# Patient Record
Sex: Female | Born: 1965 | Race: White | Hispanic: No | Marital: Married | Smoking: Never smoker
Health system: Southern US, Community
[De-identification: ages and names within clinical notes are randomized; demographics above are authoritative.]

## PROBLEM LIST (undated history)

## (undated) ENCOUNTER — Ambulatory Visit: Admission: EM | Payer: BC Managed Care – PPO

## (undated) DIAGNOSIS — D487 Neoplasm of uncertain behavior of other specified sites: Secondary | ICD-10-CM

## (undated) DIAGNOSIS — N3090 Cystitis, unspecified without hematuria: Secondary | ICD-10-CM

## (undated) DIAGNOSIS — Z1239 Encounter for other screening for malignant neoplasm of breast: Secondary | ICD-10-CM

## (undated) DIAGNOSIS — D649 Anemia, unspecified: Secondary | ICD-10-CM

## (undated) DIAGNOSIS — R21 Rash and other nonspecific skin eruption: Secondary | ICD-10-CM

## (undated) DIAGNOSIS — N6459 Other signs and symptoms in breast: Secondary | ICD-10-CM

## (undated) DIAGNOSIS — R5383 Other fatigue: Secondary | ICD-10-CM

## (undated) DIAGNOSIS — Z9289 Personal history of other medical treatment: Secondary | ICD-10-CM

## (undated) DIAGNOSIS — R5381 Other malaise: Secondary | ICD-10-CM

## (undated) HISTORY — DX: Anemia, unspecified: D64.9

## (undated) HISTORY — DX: Rash and other nonspecific skin eruption: R21

## (undated) HISTORY — DX: Other malaise: R53.81

## (undated) HISTORY — DX: Other fatigue: R53.83

## (undated) HISTORY — DX: Cystitis, unspecified without hematuria: N30.90

## (undated) HISTORY — DX: Other signs and symptoms in breast: N64.59

## (undated) HISTORY — DX: Encounter for other screening for malignant neoplasm of breast: Z12.39

## (undated) HISTORY — DX: Personal history of other medical treatment: Z92.89

## (undated) HISTORY — DX: Neoplasm of uncertain behavior of other specified sites: D48.7

---

## 2010-09-11 DIAGNOSIS — Z9289 Personal history of other medical treatment: Secondary | ICD-10-CM

## 2010-09-11 HISTORY — DX: Personal history of other medical treatment: Z92.89

## 2010-12-06 ENCOUNTER — Ambulatory Visit: Payer: Self-pay

## 2010-12-06 ENCOUNTER — Emergency Department: Payer: Self-pay | Admitting: Emergency Medicine

## 2011-09-12 DIAGNOSIS — N6459 Other signs and symptoms in breast: Secondary | ICD-10-CM

## 2011-09-12 DIAGNOSIS — D487 Neoplasm of uncertain behavior of other specified sites: Secondary | ICD-10-CM

## 2011-09-12 DIAGNOSIS — R5381 Other malaise: Secondary | ICD-10-CM

## 2011-09-12 DIAGNOSIS — N3090 Cystitis, unspecified without hematuria: Secondary | ICD-10-CM

## 2011-09-12 DIAGNOSIS — Z1239 Encounter for other screening for malignant neoplasm of breast: Secondary | ICD-10-CM

## 2011-09-12 DIAGNOSIS — R5383 Other fatigue: Secondary | ICD-10-CM

## 2011-09-12 DIAGNOSIS — R21 Rash and other nonspecific skin eruption: Secondary | ICD-10-CM

## 2011-09-12 HISTORY — DX: Cystitis, unspecified without hematuria: N30.90

## 2011-09-12 HISTORY — DX: Other signs and symptoms in breast: N64.59

## 2011-09-12 HISTORY — DX: Other malaise: R53.83

## 2011-09-12 HISTORY — PX: TUBAL LIGATION: SHX77

## 2011-09-12 HISTORY — DX: Neoplasm of uncertain behavior of other specified sites: D48.7

## 2011-09-12 HISTORY — DX: Rash and other nonspecific skin eruption: R21

## 2011-09-12 HISTORY — DX: Other malaise: R53.81

## 2011-09-12 HISTORY — DX: Encounter for other screening for malignant neoplasm of breast: Z12.39

## 2012-03-20 ENCOUNTER — Ambulatory Visit: Payer: Self-pay | Admitting: Obstetrics and Gynecology

## 2012-04-25 ENCOUNTER — Ambulatory Visit: Payer: Self-pay | Admitting: General Surgery

## 2012-07-30 ENCOUNTER — Ambulatory Visit: Payer: Self-pay | Admitting: General Surgery

## 2012-09-11 HISTORY — PX: BREAST CYST ASPIRATION: SHX578

## 2012-11-26 ENCOUNTER — Ambulatory Visit: Payer: Self-pay | Admitting: General Surgery

## 2012-12-09 ENCOUNTER — Encounter: Payer: Self-pay | Admitting: *Deleted

## 2012-12-09 DIAGNOSIS — D487 Neoplasm of uncertain behavior of other specified sites: Secondary | ICD-10-CM | POA: Insufficient documentation

## 2012-12-09 DIAGNOSIS — Z9289 Personal history of other medical treatment: Secondary | ICD-10-CM | POA: Insufficient documentation

## 2012-12-16 ENCOUNTER — Encounter: Payer: Self-pay | Admitting: General Surgery

## 2012-12-16 ENCOUNTER — Other Ambulatory Visit: Payer: Self-pay

## 2012-12-16 ENCOUNTER — Ambulatory Visit (INDEPENDENT_AMBULATORY_CARE_PROVIDER_SITE_OTHER): Payer: BC Managed Care – PPO | Admitting: General Surgery

## 2012-12-16 VITALS — BP 130/74 | HR 74 | Resp 14 | Ht 64.0 in | Wt 163.0 lb

## 2012-12-16 DIAGNOSIS — N6459 Other signs and symptoms in breast: Secondary | ICD-10-CM

## 2012-12-16 NOTE — Patient Instructions (Addendum)
Patient to return in sept.2014

## 2012-12-16 NOTE — Progress Notes (Signed)
Patient ID: Yesenia Flores, female   DOB: 19-May-1966, 47 y.o.   MRN: [id number]  Chief Complaint  Patient presents with  . Follow-up    breast u/s    HPI Yesenia Flores is a 47 y.o. female here today for her follow up right ultrasound.  The patient has followed up with [name], M.D. From the ENT service in regards to her sense of fullness in the lower neck. The previously reported flushing has resolved. The patient reports she is more symptomatic towards the end of the day. She has previously been evaluated with CT without discernible abnormality. HPI  Past Medical History  Diagnosis Date  . Other malaise and fatigue 2013  . Breast screening, unspecified 2013  . Other sign and symptom in breast 2013  . 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 Rash and other nonspecific skin eruption 2013  . Bladder infection 2013  . History of tubal ligation 2013  . H/O CT scan of chest 2012    Past Surgical History  Procedure Laterality Date  . Cesarean section  4098,1191    No family history on file.  Social History History  Substance Use Topics  . Smoking status: Never Smoker   . Smokeless tobacco: Never Used  . Alcohol Use: No    No Known Allergies  No current outpatient prescriptions on file.   No current facility-administered medications for this visit.    Review of Systems Review of Systems  Constitutional: Negative.   Respiratory: Negative.   Cardiovascular: Negative.     Blood pressure 130/74, pulse 74, resp. rate 14, height 5\' 4"  (1.626 m), weight 163 lb (73.936 kg), last menstrual period 11/19/2012.  Physical Exam Physical Exam  Constitutional: She appears well-developed and well-nourished.  Eyes: Conjunctivae are normal. Pupils are equal, round, and reactive to light.  Neck: Normal range of motion. Neck supple.  Cardiovascular: Normal rate, regular rhythm and normal heart sounds.   Pulmonary/Chest: Effort normal and breath sounds  normal. Right breast exhibits no inverted nipple, no mass, no nipple discharge, no skin change and no tenderness. Left breast exhibits no inverted nipple, no mass, no nipple discharge, no skin change and no tenderness.  Lymphadenopathy:       Right axillary: No pectoral and no lateral adenopathy present.       Left axillary: No pectoral and no lateral adenopathy present.   Data Reviewed  Right breast mammogram dated November 27, 2012 showed nodularity which had not dramatically changed from prior exams. BI-RAD-3.  Ultrasound examination of the right breast was completed.  At the 7:00 position 4 cm from the nipple a 0.47 x 0.54 x 0.8 cm simple cyst is identified. At the 12:00 position 1 cm from the nipple a heterogeneous hypoechoic area with acoustic enhancement measuring 0.64 x 0.99 x 1.22 cm is noted. Adjacent to this is a 0.73 x 0.74 0.78 simple cyst. At the 10:00 position 2 cm for the nipple a taller than wide anechoic structure measuring 0.62 x 0.69 x 0.78 cm was identified. Aspiration was recommended as the orientation was significantly different from all the lesions in her breast. She was amenable to proceed.  1 cc of 1% plain Xylocaine was utilized and the area was aspirated with complete resolution. She tolerated the procedure well.  Assessment    Multiple breast cysts, questionable solid lesion at the 12:00 position.  No evidence of cervical or supraclavicular adenopathy.    Plan    The patient will be  contact with the cytology results are available.        Yesenia Flores 12/17/2012, 9:33 PM

## 2012-12-17 ENCOUNTER — Encounter: Payer: Self-pay | Admitting: General Surgery

## 2012-12-19 ENCOUNTER — Telehealth: Payer: Self-pay | Admitting: *Deleted

## 2012-12-19 LAB — FINE-NEEDLE ASPIRATION

## 2012-12-19 NOTE — Telephone Encounter (Signed)
Patient called in today wanting results from 12-16-12. I did not see anything in the system. Have you received a verbal. Please call patient once we get results. Thanks.

## 2012-12-20 NOTE — Progress Notes (Signed)
Quick Note:  The cytology obtained on the cystic lesion that was taller than it was wide was benign.  The patient was notified of these results today.  Will plan for a followup examination with bilateral mammograms in 6 months. ______

## 2012-12-20 NOTE — Progress Notes (Signed)
Patient is in the recalls for bilateral diagnostic mammogram at [facility] for September 2014. Thanks.

## 2013-01-06 ENCOUNTER — Emergency Department: Payer: Self-pay | Admitting: Emergency Medicine

## 2013-01-06 LAB — CBC WITH DIFFERENTIAL/PLATELET
Basophil #: 0.1 10*3/uL (ref 0.0–0.1)
Basophil %: 1.7 %
HCT: 32.5 % — ABNORMAL LOW (ref 35.0–47.0)
Lymphocyte #: 1.3 10*3/uL (ref 1.0–3.6)
Lymphocyte %: 19.6 %
MCH: 25.9 pg — ABNORMAL LOW (ref 26.0–34.0)
Monocyte #: 0.6 x10 3/mm (ref 0.2–0.9)
Neutrophil #: 4.7 10*3/uL (ref 1.4–6.5)
Platelet: 277 10*3/uL (ref 150–440)
RDW: 14.9 % — ABNORMAL HIGH (ref 11.5–14.5)
WBC: 6.9 10*3/uL (ref 3.6–11.0)

## 2013-01-06 LAB — BASIC METABOLIC PANEL
Anion Gap: 7 (ref 7–16)
Calcium, Total: 9.1 mg/dL (ref 8.5–10.1)
Creatinine: 0.77 mg/dL (ref 0.60–1.30)
Glucose: 100 mg/dL — ABNORMAL HIGH (ref 65–99)

## 2013-04-28 ENCOUNTER — Emergency Department: Payer: Self-pay | Admitting: Emergency Medicine

## 2013-04-28 LAB — COMPREHENSIVE METABOLIC PANEL
Albumin: 3.7 g/dL (ref 3.4–5.0)
Alkaline Phosphatase: 65 U/L (ref 50–136)
Anion Gap: 4 — ABNORMAL LOW (ref 7–16)
BUN: 10 mg/dL (ref 7–18)
Calcium, Total: 9.1 mg/dL (ref 8.5–10.1)
Chloride: 108 mmol/L — ABNORMAL HIGH (ref 98–107)
Co2: 25 mmol/L (ref 21–32)
EGFR (African American): >60
EGFR (Non-African Amer.): >60
Glucose: 94 mg/dL (ref 65–99)
Osmolality: 273 (ref 275–301)
Potassium: 4 mmol/L (ref 3.5–5.1)
SGOT(AST): 20 U/L (ref 15–37)
SGPT (ALT): 15 U/L (ref 12–78)
Sodium: 137 mmol/L (ref 136–145)
Total Protein: 7.4 g/dL (ref 6.4–8.2)

## 2013-04-28 LAB — CBC
HCT: 32 % — ABNORMAL LOW (ref 35.0–47.0)
MCV: 77 fL — ABNORMAL LOW (ref 80–100)
Platelet: 230 10*3/uL (ref 150–440)
RDW: 15 % — ABNORMAL HIGH (ref 11.5–14.5)
WBC: 6.1 10*3/uL (ref 3.6–11.0)

## 2013-04-28 LAB — TROPONIN I: Troponin-I: <0.02 ng/mL

## 2013-05-06 ENCOUNTER — Ambulatory Visit: Payer: Self-pay | Admitting: Obstetrics and Gynecology

## 2013-06-10 ENCOUNTER — Ambulatory Visit: Payer: BC Managed Care – PPO | Admitting: General Surgery

## 2013-07-09 ENCOUNTER — Encounter: Payer: Self-pay | Admitting: *Deleted

## 2013-07-17 ENCOUNTER — Other Ambulatory Visit: Payer: Self-pay

## 2014-05-29 ENCOUNTER — Emergency Department: Payer: Self-pay | Admitting: Emergency Medicine

## 2014-07-08 ENCOUNTER — Encounter: Payer: Self-pay | Admitting: General Surgery

## 2014-07-08 ENCOUNTER — Ambulatory Visit: Payer: Self-pay | Admitting: General Surgery

## 2014-07-13 ENCOUNTER — Encounter: Payer: Self-pay | Admitting: General Surgery

## 2014-07-14 ENCOUNTER — Telehealth: Payer: Self-pay

## 2014-07-14 NOTE — Telephone Encounter (Signed)
Message left for patient to call back to reschedule her follow up appointment with Dr Bary Castilla after she has additional mammogram views done as recommended.

## 2014-07-14 NOTE — Telephone Encounter (Signed)
-----   Message from Robert Bellow, MD sent at 07/09/2014  4:28 PM EDT ----- The patient needs an appointment after additional views of the left breast. Thanks/ ----- Message -----    From: Darrin Nipper, CMA    Sent: 07/08/2014   4:44 PM      To: Robert Bellow, MD

## 2014-07-17 ENCOUNTER — Ambulatory Visit: Payer: Self-pay | Admitting: General Surgery

## 2014-07-20 ENCOUNTER — Encounter: Payer: Self-pay | Admitting: General Surgery

## 2015-01-07 ENCOUNTER — Telehealth: Payer: Self-pay | Admitting: General Surgery

## 2015-01-07 NOTE — Telephone Encounter (Signed)
ERROR

## 2015-01-07 NOTE — Telephone Encounter (Signed)
L/M ON H# TO CALL ME BACK TO SET UP AN APPT WITH DR BYRNETT.SEE TELEPHONE NOTE ENTERED BY CARYL-LYN. SPOKE [facility] COME IN & SEE DR BYRNETT & IF HE WANTS A MAMMO IT WILL BE SCH'D @ THAT TIME. IF SHE CHOOSE TO SEE DR Adelfa Koh MUST FIRST ASK HIM. PLEASE MAKE PT AWARE OF BAD DEBT AMT & WILL NEED TO PAY SOMETHING AT VISIT.WILL NEEDED HER INS, LICENSE, CO-PAY & MEDS.HER LAST NAME HAS ONLY BEEN CHANGED IN THE REGISTRATION FIELD(ALREADY CHANGED BEFORE I BEGAN WORKING WITH PT).WE NEED PROPER ID TO TRULY CHANGE NAME & THEN CAN CHANGE GUARANTOR(THAT WASN'T CHANGED)  BEFORE CHANGING ANY NAME CK WITH MAUREEN BYRNTT/MTH

## 2015-01-20 ENCOUNTER — Ambulatory Visit: Payer: Self-pay | Admitting: General Surgery

## 2015-02-10 ENCOUNTER — Encounter: Payer: Self-pay | Admitting: *Deleted

## 2015-05-27 ENCOUNTER — Encounter: Payer: Self-pay | Admitting: Emergency Medicine

## 2015-05-27 ENCOUNTER — Telehealth: Payer: Self-pay | Admitting: Emergency Medicine

## 2015-05-27 ENCOUNTER — Emergency Department
Admission: EM | Admit: 2015-05-27 | Discharge: 2015-05-27 | Disposition: A | Discharge status: Home / Self Care | Payer: 59 | Attending: Emergency Medicine | Admitting: Emergency Medicine

## 2015-05-27 ENCOUNTER — Other Ambulatory Visit: Payer: Self-pay

## 2015-05-27 ENCOUNTER — Emergency Department: Payer: 59

## 2015-05-27 DIAGNOSIS — R079 Chest pain, unspecified: Secondary | ICD-10-CM | POA: Diagnosis not present

## 2015-05-27 DIAGNOSIS — M7918 Myalgia, other site: Secondary | ICD-10-CM

## 2015-05-27 LAB — BASIC METABOLIC PANEL
Anion gap: 6 (ref 5–15)
BUN: 10 mg/dL (ref 6–20)
CHLORIDE: 107 mmol/L (ref 101–111)
CO2: 27 mmol/L (ref 22–32)
Calcium: 9.5 mg/dL (ref 8.9–10.3)
Creatinine, Ser: 0.91 mg/dL (ref 0.44–1.00)
GFR calc non Af Amer: >60 mL/min (ref >60)
Glucose, Bld: 99 mg/dL (ref 65–99)
POTASSIUM: 3.6 mmol/L (ref 3.5–5.1)
SODIUM: 140 mmol/L (ref 135–145)

## 2015-05-27 LAB — CBC
HEMATOCRIT: 28.5 % — AB (ref 35.0–47.0)
Hemoglobin: 8.5 g/dL — ABNORMAL LOW (ref 12.0–16.0)
MCH: 21 pg — AB (ref 26.0–34.0)
MCHC: 30 g/dL — ABNORMAL LOW (ref 32.0–36.0)
MCV: 70 fL — AB (ref 80.0–100.0)
Platelets: 211 10*3/uL (ref 150–440)
RBC: 4.07 MIL/uL (ref 3.80–5.20)
RDW: 17 % — ABNORMAL HIGH (ref 11.5–14.5)
WBC: 4.3 10*3/uL (ref 3.6–11.0)

## 2015-05-27 LAB — TROPONIN I: Troponin I: <0.03 ng/mL (ref <0.031)

## 2015-05-27 LAB — FIBRIN DERIVATIVES D-DIMER (ARMC ONLY): Fibrin derivatives D-dimer (ARMC): 439 (ref 0–499)

## 2015-05-27 MED ORDER — IBUPROFEN 600 MG PO TABS
600.0000 mg | ORAL_TABLET | Freq: Once | ORAL | Status: AC
  Administered 2015-05-27: 600 mg via ORAL
  Filled 2015-05-27: qty 1

## 2015-05-27 NOTE — Discharge Instructions (Signed)
You were evaluated for chest discomfort, for which no certain cause was found, however I suspect likely musculoskeletal pain. However due to multiple episodes of chest discomfort, I do think he should be evaluated by cardiologist for further investigation of atypical chest pain. Call Dr.Khan's office for an appointment in 1-2 days.  Return to the emergency department for any new or worsening condition including chest pain, trouble breathing, fast heartbeat, weakness, numbness, dizziness, passing out, or any other symptoms concerning to you.   Chest Pain (Nonspecific) It is often hard to give a diagnosis for the cause of chest pain. There is always a chance that your pain could be related to something serious, such as a heart attack or a blood clot in the lungs. You need to follow up with your doctor. HOME CARE  If antibiotic medicine was given, take it as directed by your doctor. Finish the medicine even if you start to feel better.  For the next few days, avoid activities that bring on chest pain. Continue physical activities as told by your doctor.  Do not use any tobacco products. This includes cigarettes, chewing tobacco, and e-cigarettes.  Avoid drinking alcohol.  Only take medicine as told by your doctor.  Follow your doctor's suggestions for more testing if your chest pain does not go away.  Keep all doctor visits you made. GET HELP IF:  Your chest pain does not go away, even after treatment.  You have a rash with blisters on your chest.  You have a fever. GET HELP RIGHT AWAY IF:   You have more pain or pain that spreads to your arm, neck, jaw, back, or belly (abdomen).  You have shortness of breath.  You cough more than usual or cough up blood.  You have very bad back or belly pain.  You feel sick to your stomach (nauseous) or throw up (vomit).  You have very bad weakness.  You pass out (faint).  You have chills. This is an emergency. Do not wait to see if the  problems will go away. Call your local emergency services (911 in [place] Do not drive yourself to the hospital. MAKE SURE YOU:   Understand these instructions.  Will watch your condition.  Will get help right away if you are not doing well or get worse. Document Released: 02/14/2008 Document Revised: 09/02/2013 Document Reviewed: 02/14/2008 [facility] Patient Information 2015 [place], [place]. This information is not intended to replace advice given to you by your health care provider. Make sure you discuss any questions you have with your health care provider.  Musculoskeletal Pain Musculoskeletal pain is muscle and boney aches and pains. These pains can occur in any part of the body. Your caregiver may treat you without knowing the cause of the pain. They may treat you if blood or urine tests, X-rays, and other tests were normal.  CAUSES There is often not a definite cause or reason for these pains. These pains may be caused by a type of germ (virus). The discomfort may also come from overuse. Overuse includes working out too hard when your body is not fit. Boney aches also come from weather changes. Bone is sensitive to atmospheric pressure changes. HOME CARE INSTRUCTIONS   Ask when your test results will be ready. Make sure you get your test results.  Only take over-the-counter or prescription medicines for pain, discomfort, or fever as directed by your caregiver. If you were given medications for your condition, do not drive, operate machinery or power tools,  or sign legal documents for 24 hours. Do not drink alcohol. Do not take sleeping pills or other medications that may interfere with treatment.  Continue all activities unless the activities cause more pain. When the pain lessens, slowly resume normal activities. Gradually increase the intensity and duration of the activities or exercise.  During periods of severe pain, bed rest may be helpful. Lay or sit in any position that is  comfortable.  Putting ice on the injured area.  Put ice in a bag.  Place a towel between your skin and the bag.  Leave the ice on for 15 to 20 minutes, 3 to 4 times a day.  Follow up with your caregiver for continued problems and no reason can be found for the pain. If the pain becomes worse or does not go away, it may be necessary to repeat tests or do additional testing. Your caregiver may need to look further for a possible cause. SEEK IMMEDIATE MEDICAL CARE IF:  You have pain that is getting worse and is not relieved by medications.  You develop chest pain that is associated with shortness or breath, sweating, feeling sick to your stomach (nauseous), or throw up (vomit).  Your pain becomes localized to the abdomen.  You develop any new symptoms that seem different or that concern you. MAKE SURE YOU:   Understand these instructions.  Will watch your condition.  Will get help right away if you are not doing well or get worse. Document Released: 08/28/2005 Document Revised: 11/20/2011 Document Reviewed: 05/02/2013 [facility] Patient Information 2015 [place], [place]. This information is not intended to replace advice given to you by your health care provider. Make sure you discuss any questions you have with your health care provider.

## 2015-05-27 NOTE — ED Provider Notes (Signed)
[facility] Emergency Department Provider Note   ____________________________________________  Time seen: 7:30 AM I have reviewed the triage vital signs and the triage nursing note.  HISTORY  Chief Complaint Chest Pain   Historian Patient  HPI Yesenia Flores is a 49 y.o. female who is presenting for left sided chest pain this morning around 3 AM, also associated left posterior thoracic pain with movement and with deep breaths. She states that about 3 weeks ago she had somewhat similar symptoms and was diagnosed with pneumonia, stating that the provider "thought she saw some fluid. "Patient did take a course of antibiotic, and reported possibly one week of minimal improvement, but she has had on and off problems since about 3 weeks ago. Patient states she is under a lot of stress and at work she developed sharp left-sided chest pain that went through to the left thoracic wall. No abdominal pain or nausea. She does occasionally have shortness of breath, but none now. No chest pain now. No history of cardiac or lung past medical history. She is nonsmoker.  Patient states she thinks she probably has "tense muscles. "    Past Medical History  Diagnosis Date  . Other malaise and fatigue 2013  . Breast screening, unspecified 2013  . Other sign and symptom in breast 2013  . 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 Rash and other nonspecific skin eruption 2013  . Bladder infection 2013  . History of tubal ligation 2013  . H/O CT scan of chest 2012    Patient Active Problem List   Diagnosis Date Noted  . 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 H/O CT scan of chest     Past Surgical History  Procedure Laterality Date  . Cesarean section  2836,6294    No current outpatient prescriptions on file.  Allergies Review of patient's allergies indicates no known allergies.  No family history on file.  Social  History Social History  Substance Use Topics  . Smoking status: Never Smoker   . Smokeless tobacco: Never Used  . Alcohol Use: No    Review of Systems  Constitutional: Negative for fever. Eyes: Negative for visual changes. ENT: Negative for sore throat. Cardiovascular: Negative for palpitations. Respiratory: Occasional/intermittent cough with clear sputum. No wheezing. Gastrointestinal: Negative for abdominal pain, vomiting and diarrhea. Genitourinary: Negative for dysuria. Musculoskeletal: Negative for back pain. Skin: Negative for rash. Neurological: Negative for headache. 10 point Review of Systems otherwise negative ____________________________________________   PHYSICAL EXAM:  VITAL SIGNS: ED Triage Vitals  Enc Vitals Group     BP 05/27/15 0519 153/86 mmHg     Pulse Rate 05/27/15 0519 81     Resp 05/27/15 0519 18     Temp 05/27/15 0519 97.9 F (36.6 C)     Temp Source 05/27/15 0519 Oral     SpO2 05/27/15 0519 100 %     Weight 05/27/15 0519 155 lb (70.308 kg)     Height 05/27/15 0519 5\' 5"  (1.651 m)     Head Cir --      Peak Flow --      Pain Score 05/27/15 0519 7     Pain Loc --      Pain Edu? --      Excl. in [place]? --      Constitutional: Alert and oriented. Well appearing and in no distress. Eyes: Conjunctivae are normal. PERRL. Normal extraocular movements. ENT   Head: Normocephalic  and atraumatic.   Nose: No congestion/rhinnorhea.   Mouth/Throat: Mucous membranes are moist.   Neck: No stridor. Cardiovascular/Chest: Normal rate, regular rhythm.  No murmurs, rubs, or gallops. Respiratory: Normal respiratory effort without tachypnea nor retractions. Breath sounds are clear and equal bilaterally. No wheezes/rales/rhonchi. Gastrointestinal: Soft. No distention, no guarding, no rebound. Nontender Genitourinary/rectal:Deferred Musculoskeletal: Nontender with normal range of motion in all extremities. No joint effusions.  No lower extremity  tenderness.  No edema. Neurologic:  Normal speech and language. No gross or focal neurologic deficits are appreciated. Skin:  Skin is warm, dry and intact. No rash noted. Psychiatric: Mood and affect are normal. Speech and behavior are normal. Patient exhibits appropriate insight and judgment.  ____________________________________________   EKG I, Lisa Roca, MD, the attending physician have personally viewed and interpreted all ECGs.  74 bpm. Normal sinus rhythm. Narrow QRS. Normal axis. Nonspecific T wave. ____________________________________________  LABS (pertinent positives/negatives)  Basic metabolic panel without abnormality White blood count 4.3, hemoglobin 8.5, platelet count 211 Troponin less than 0.03 D-dimer 439 Repeat troponin less than 0.03  ____________________________________________  RADIOLOGY All Xrays were viewed by me. Imaging interpreted by Radiologist.  Chest x-ray: No acute pulmonary process __________________________________________  PROCEDURES  Procedure(s) performed: None  Critical Care performed: None  ____________________________________________   ED COURSE / ASSESSMENT AND PLAN  CONSULTATIONS: None  Pertinent labs & imaging results that were available during my care of the patient were reviewed by me and considered in my medical decision making (see chart for details).   Patient chest pain over 4 hours ago, which is atypical, and I doubt acute coronary syndrome. Her EKG is reassuring as is her troponin blood draw. I will send a q3hour level at 8:20 AM.  I'm considering the possibility for PE, as she's had some shortness of breath and some pleuritic component to the left posterior thoracic wall discomfort, however she is low risk and has no hypoxia, tachycardia, fever or anterior chest pain. I discussed with her the risk and benefits of proceeding to the chest CT given that she's had some symptoms ongoing now for 3 weeks, without full  resolution/diagnosis. However I do think she is low risk, and I feel PE is extremely unlikely, and I discussed proceeding with d-dimer as I think this is appropriate clinical pathway to follow. The patient is in agreement that she wants to do the blood test, and not proceed straight to the chest CT.  D-dimer negative, repeat troponin negative. I suspect muscular skeletal pain. However I will refer her for follow-up with a cardiologist.  ----------------------------------------- 12:00 PM on 05/27/2015 -----------------------------------------  Due to delay in being able to bring patient her discharge instructions, she did leave prior to me giving her paperwork. No shortening or will call patient to retrieve her discharge instructions.   Patient / Family / Caregiver informed of clinical course, medical decision-making process, and agree with plan.    ___________________________________________   FINAL CLINICAL IMPRESSION(S) / ED DIAGNOSES   Final diagnoses:  Chest pain, unspecified chest pain type  Musculoskeletal pain       Lisa Roca, MD 05/27/15 1201

## 2015-05-27 NOTE — ED Notes (Addendum)
Patient ambulatory to triage with steady gait, without difficulty or distress noted; pt reports seen 3wks ago at Urgent Care for CP and dx with poss pneumonia, rx 5day course antibiotic but pain persists; c/o left sided CP that radiates into back; prod cough at times clear sputum; SOB with exertion; denies hx of same

## 2015-05-27 NOTE — ED Notes (Signed)
Called pt per dr Reita Cliche request to inform that she can pick up instructions.  i called and left message stating that instrucions would be at lobby desk, and gave her my numbers so she can call for any questions about instructions.

## 2015-05-27 NOTE — ED Notes (Addendum)
Pt states she needed to leave, had to be somewhere.  I informed pt that we cannot hold her. Pt stated she will come back to get paperwork later.

## 2016-01-12 ENCOUNTER — Ambulatory Visit (INDEPENDENT_AMBULATORY_CARE_PROVIDER_SITE_OTHER): Payer: 59 | Admitting: Obstetrics and Gynecology

## 2016-01-12 ENCOUNTER — Encounter: Payer: Self-pay | Admitting: Obstetrics and Gynecology

## 2016-01-12 VITALS — BP 137/80 | HR 90 | Ht 64.0 in | Wt 161.5 lb

## 2016-01-12 DIAGNOSIS — Z01419 Encounter for gynecological examination (general) (routine) without abnormal findings: Secondary | ICD-10-CM

## 2016-01-12 DIAGNOSIS — N951 Menopausal and female climacteric states: Secondary | ICD-10-CM | POA: Diagnosis not present

## 2016-01-12 DIAGNOSIS — R928 Other abnormal and inconclusive findings on diagnostic imaging of breast: Secondary | ICD-10-CM | POA: Diagnosis not present

## 2016-01-12 DIAGNOSIS — N941 Unspecified dyspareunia: Secondary | ICD-10-CM | POA: Diagnosis not present

## 2016-01-12 LAB — POCT URINALYSIS DIPSTICK
Bilirubin, UA: NEGATIVE
GLUCOSE UA: NEGATIVE
KETONES UA: NEGATIVE
Nitrite, UA: NEGATIVE
PH UA: 5
Protein, UA: NEGATIVE
RBC UA: NEGATIVE
SPEC GRAV UA: 1.01
Urobilinogen, UA: NEGATIVE

## 2016-01-12 NOTE — Progress Notes (Signed)
GYNECOLOGY ANNUAL PHYSICAL EXAM PROGRESS NOTE  Subjective:    Yesenia Flores is a 50 y.o. [phone] married female who presents to establish care, and for an annual exam. The patient is sexually active. GYN screening history: last pap: approximate date 1-2 years ago and was normal and last mammogram: approximate date 1 year ago and was abnormal: BIRADS 3 (calcifications in left breast).  Patient supposed to get q 6 month mammos, however did not follow up as she was uncomfortable with last provider. The patient wears seatbelts: yes. The patient participates in regular exercise: no. Has the patient ever been transfused or tattooed?: no. The patient reports that there is not domestic violence in her life.   The patient has the following complaints today:  1) Dyspareunia - secondary to vaginal dryness.   2) Difficulties sleeping.  Notes that it is hard to fall asleep and stay asleep.  Has tried Lavender, benadryl, Nyquill.  Takes Tylenol p.m. currently but wakes up drowsy.  3) Possible hot flashes (patient notes that she is unsure because she is normally cold natured, but has them at night (however also allows new dog to sleep with her and so may be causing her to become warmer).  4) No menstrual cycle in 7 months.    Obstetric History: Obstetric History   G4   P4   T4   P0   A0   TAB0   SAB0   E0   M0   L8     # Outcome Date GA Lbr Len/2nd Weight Sex Delivery Anes PTL Lv  4 Term      CS-LTranv   Y  3 Term      CS-LTranv   Y  2 Term      Vag-Spont   Y  1 Term      Vag-Spont   Y    Obstetric Comments  Age at first menstruation-9  Age at first pregnancy-15      Gynecologic History:  Patient's last menstrual period was 06/22/2015. Contraception: tubal ligation History of STI's: Denies   Past Medical History  Diagnosis Date  . Other malaise and fatigue 2013  . Breast screening, unspecified 2013  . Other sign and symptom in breast 2013  . 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 Rash and other nonspecific skin eruption 2013  . Bladder infection 2013  . H/O CT scan of chest 2012  . Anemia     Past Surgical History  Procedure Laterality Date  . Cesarean section  [id number]  . Tubal ligation Bilateral 2013    Family History  Problem Relation Age of Onset  . Thyroid disease Mother     Social History   Social History  . Marital Status: Married    Spouse Name: N/A  . Number of Children: N/A  . Years of Education: N/A   Occupational History  . Not on file.   Social History Main Topics  . Smoking status: Light Tobacco Smoker  . Smokeless tobacco: Never Used  . Alcohol Use: No  . Drug Use: No  . Sexual Activity: Yes    Birth Control/ Protection: Post-menopausal   Other Topics Concern  . Not on file   Social History Narrative    No current outpatient prescriptions on file prior to visit.   No current facility-administered medications on file prior to visit.    No Known Allergies    Review of Systems Constitutional: negative for chills, fatigue, fevers  and sweats Eyes: negative for irritation, redness and visual disturbance Ears, nose, mouth, throat, and face: negative for hearing loss, nasal congestion, snoring and tinnitus Respiratory: negative for asthma, cough, sputum Cardiovascular: negative for chest pain, dyspnea, exertional chest pressure/discomfort, irregular heart beat, palpitations and syncope Gastrointestinal: negative for abdominal pain, change in bowel habits, nausea and vomiting Genitourinary: negative for abnormal menstrual periods, genital lesions, sexual problems and vaginal discharge, dysuria and urinary incontinence Integument/breast: negative for breast lump, breast tenderness and nipple discharge Hematologic/lymphatic: negative for bleeding and easy bruising Musculoskeletal:negative for back pain and muscle weakness Neurological: negative for dizziness, headaches, vertigo and  weakness Endocrine: negative for diabetic symptoms including polydipsia, polyuria and skin dryness Allergic/Immunologic: negative for hay fever and urticaria       Objective:  Blood pressure 137/80, pulse 90, height 5\' 4"  (1.626 m), weight 161 lb 8 oz (73.256 kg), last menstrual period 11/19/2012. Body mass index is 27.71 kg/(m^2).  General Appearance:    Alert, cooperative, no distress, appears stated age, overweight  Head:    Normocephalic, without obvious abnormality, atraumatic  Eyes:    PERRL, conjunctiva/corneas clear, EOM's intact, both eyes  Ears:    Normal external ear canals, both ears  Nose:   Nares normal, septum midline, mucosa normal, no drainage or sinus tenderness  Throat:   Lips, mucosa, and tongue normal; teeth and gums normal  Neck:   Supple, symmetrical, trachea midline, no adenopathy; thyroid: no enlargement/tenderness/nodules; no carotid bruit or JVD  Back:     Symmetric, no curvature, ROM normal, no CVA tenderness  Lungs:     Clear to auscultation bilaterally, respirations unlabored  Chest Wall:    No tenderness or deformity   Heart:    Regular rate and rhythm, S1 and S2 normal, no murmur, rub or gallop  Breast Exam:    No tenderness, masses, or nipple abnormality  Abdomen:     Soft, non-tender, bowel sounds active all four quadrants, no masses, no organomegaly.  Well healed Pfannenstiel incision.   Genitalia:    Pelvic:external genitalia normal, vagina without lesions, discharge, or tenderness, rectovaginal septum  normal. Cervix normal in appearance, no cervical motion tenderness, no adnexal masses or tenderness.  Uterus normal size, shape, mobile, regular contours, nontender.  Rectal:    Normal external sphincter.  No hemorrhoids appreciated. Internal exam not done.   Extremities:   Extremities normal, atraumatic, no cyanosis or edema  Pulses:   2+ and symmetric all extremities  Skin:   Skin color, texture, turgor normal, no rashes or lesions  Lymph nodes:    Cervical, supraclavicular, and axillary nodes normal  Neurologic:   CNII-XII intact, normal strength, sensation and reflexes throughout   .  Labs:  Lab Results  Component Value Date   WBC 4.3 05/27/2015   HGB 8.5* 05/27/2015   HCT 28.5* 05/27/2015   MCV 70.0* 05/27/2015   PLT 211 05/27/2015    Lab Results  Component Value Date   CREATININE 0.91 05/27/2015   BUN 10 05/27/2015   NA 140 05/27/2015   K 3.6 05/27/2015   CL 107 05/27/2015   CO2 27 05/27/2015    Lab Results  Component Value Date   ALT 15 04/28/2013   AST 20 04/28/2013   ALKPHOS 65 04/28/2013   BILITOT 0.3 04/28/2013    No results found for: TSH   Assessment:   Routine gynecologic exam Perimenopausal symptoms H/o abnormal mammogram H/o anemia  Plan:    Blood tests: CBC with diff, Comprehensive metabolic panel,  Lipoproteins and TSH. Breast self exam technique reviewed and patient encouraged to perform self-exam monthly. Contraception: tubal ligation. Discussed healthy lifestyle modifications. Mammogram and ultrasound ordered for h/o abnormal left mammogram.  Pap smear up to date.  Will need to repeat in 2018.  H/o anemia.  Patient denies heavy menses.  Has had no menses in 7 months. Will repeat CBC.   Perimenopausal symptoms. Patient with bothersome symptoms. Discussed lifestyle interventions such as wearing light clothing, remaining in cool environments, having fan/air conditioner in the room, avoiding hot beverages etc.  Discussed using hormone therapy and concerns about increased risk of heart disease, cerebrovascular disease, thromboembolic disease,  and breast cancer.  Also discussed other medical options such as Paxil, Effexor or Neurontin, and melatonin for sleep.  Offered lubricants for vaginal dryness.  Will reassess symptoms after current interventions tried, and after mammogram performed (patient currently considering initiating hormonal therapy, however has h/o abnormal mammogram).   Rubie Maid, MD [facility]

## 2016-01-12 NOTE — Patient Instructions (Signed)
Preventive Care for Adults, Female A healthy lifestyle and preventive care can promote health and wellness. Preventive health guidelines for women include the following key practices.  A routine yearly physical is a good way to check with your health care provider about your health and preventive screening. It is a chance to share any concerns and updates on your health and to receive a thorough exam.  Visit your dentist for a routine exam and preventive care every 6 months. Brush your teeth twice a day and floss once a day. Good oral hygiene prevents tooth decay and gum disease.  The frequency of eye exams is based on your age, health, family medical history, use of contact lenses, and other factors. Follow your health care provider's recommendations for frequency of eye exams.  Eat a healthy diet. Foods like vegetables, fruits, whole grains, low-fat dairy products, and lean protein foods contain the nutrients you need without too many calories. Decrease your intake of foods high in solid fats, added sugars, and salt. Eat the right amount of calories for you.Get information about a proper diet from your health care provider, if necessary.  Regular physical exercise is one of the most important things you can do for your health. Most adults should get at least 150 minutes of moderate-intensity exercise (any activity that increases your heart rate and causes you to sweat) each week. In addition, most adults need muscle-strengthening exercises on 2 or more days a week.  Maintain a healthy weight. The body mass index (BMI) is a screening tool to identify possible weight problems. It provides an estimate of body fat based on height and weight. Your health care provider can find your BMI and can help you achieve or maintain a healthy weight.For adults 20 years and older:  A BMI below 18.5 is considered underweight.  A BMI of 18.5 to 24.9 is normal.  A BMI of 25 to 29.9 is considered  overweight.  A BMI of 30 and above is considered obese.  Maintain normal blood lipids and cholesterol levels by exercising and minimizing your intake of saturated fat. Eat a balanced diet with plenty of fruit and vegetables. Blood tests for lipids and cholesterol should begin at age 64 and be repeated every 5 years. If your lipid or cholesterol levels are high, you are over 50, or you are at high risk for heart disease, you may need your cholesterol levels checked more frequently.Ongoing high lipid and cholesterol levels should be treated with medicines if diet and exercise are not working.  If you smoke, find out from your health care provider how to quit. If you do not use tobacco, do not start.  Lung cancer screening is recommended for adults aged 52-80 years who are at high risk for developing lung cancer because of a history of smoking. A yearly low-dose CT scan of the lungs is recommended for people who have at least a 30-pack-year history of smoking and are a current smoker or have quit within the past 15 years. A pack year of smoking is smoking an average of 1 pack of cigarettes a day for 1 year (for example: 1 pack a day for 30 years or 2 packs a day for 15 years). Yearly screening should continue until the smoker has stopped smoking for at least 15 years. Yearly screening should be stopped for people who develop a health problem that would prevent them from having lung cancer treatment.  If you are pregnant, do not drink alcohol. If you are  breastfeeding, be very cautious about drinking alcohol. If you are not pregnant and choose to drink alcohol, do not have more than 1 drink per day. One drink is considered to be 12 ounces (355 mL) of beer, 5 ounces (148 mL) of wine, or 1.5 ounces (44 mL) of liquor.  Avoid use of street drugs. Do not share needles with anyone. Ask for help if you need support or instructions about stopping the use of drugs.  High blood pressure causes heart disease and  increases the risk of stroke. Your blood pressure should be checked at least every 1 to 2 years. Ongoing high blood pressure should be treated with medicines if weight loss and exercise do not work.  If you are 25-78 years old, ask your health care provider if you should take aspirin to prevent strokes.  Diabetes screening is done by taking a blood sample to check your blood glucose level after you have not eaten for a certain period of time (fasting). If you are not overweight and you do not have risk factors for diabetes, you should be screened once every 3 years starting at age 86. If you are overweight or obese and you are 3-87 years of age, you should be screened for diabetes every year as part of your cardiovascular risk assessment.  Breast cancer screening is essential preventive care for women. You should practice "breast self-awareness." This means understanding the normal appearance and feel of your breasts and may include breast self-examination. Any changes detected, no matter how small, should be reported to a health care provider. Women in their 66s and 30s should have a clinical breast exam (CBE) by a health care provider as part of a regular health exam every 1 to 3 years. After age 43, women should have a CBE every year. Starting at age 37, women should consider having a mammogram (breast X-ray test) every year. Women who have a family history of breast cancer should talk to their health care provider about genetic screening. Women at a high risk of breast cancer should talk to their health care providers about having an MRI and a mammogram every year.  Breast cancer gene (BRCA)-related cancer risk assessment is recommended for women who have family members with BRCA-related cancers. BRCA-related cancers include breast, ovarian, tubal, and peritoneal cancers. Having family members with these cancers may be associated with an increased risk for harmful changes (mutations) in the breast  cancer genes BRCA1 and BRCA2. Results of the assessment will determine the need for genetic counseling and BRCA1 and BRCA2 testing.  Your health care provider may recommend that you be screened regularly for cancer of the pelvic organs (ovaries, uterus, and vagina). This screening involves a pelvic examination, including checking for microscopic changes to the surface of your cervix (Pap test). You may be encouraged to have this screening done every 3 years, beginning at age 78.  For women ages 79-65, health care providers may recommend pelvic exams and Pap testing every 3 years, or they may recommend the Pap and pelvic exam, combined with testing for human papilloma virus (HPV), every 5 years. Some types of HPV increase your risk of cervical cancer. Testing for HPV may also be done on women of any age with unclear Pap test results.  Other health care providers may not recommend any screening for nonpregnant women who are considered low risk for pelvic cancer and who do not have symptoms. Ask your health care provider if a screening pelvic exam is right for  you.  If you have had past treatment for cervical cancer or a condition that could lead to cancer, you need Pap tests and screening for cancer for at least 20 years after your treatment. If Pap tests have been discontinued, your risk factors (such as having a new sexual partner) need to be reassessed to determine if screening should resume. Some women have medical problems that increase the chance of getting cervical cancer. In these cases, your health care provider may recommend more frequent screening and Pap tests.  Colorectal cancer can be detected and often prevented. Most routine colorectal cancer screening begins at the age of 50 years and continues through age 75 years. However, your health care provider may recommend screening at an earlier age if you have risk factors for colon cancer. On a yearly basis, your health care provider may provide  home test kits to check for hidden blood in the stool. Use of a small camera at the end of a tube, to directly examine the colon (sigmoidoscopy or colonoscopy), can detect the earliest forms of colorectal cancer. Talk to your health care provider about this at age 50, when routine screening begins. Direct exam of the colon should be repeated every 5-10 years through age 75 years, unless early forms of precancerous polyps or small growths are found.  People who are at an increased risk for hepatitis B should be screened for this virus. You are considered at high risk for hepatitis B if:  You were born in a country where hepatitis B occurs often. Talk with your health care provider about which countries are considered high risk.  Your parents were born in a high-risk country and you have not received a shot to protect against hepatitis B (hepatitis B vaccine).  You have HIV or AIDS.  You use needles to inject street drugs.  You live with, or have sex with, someone who has hepatitis B.  You get hemodialysis treatment.  You take certain medicines for conditions like cancer, organ transplantation, and autoimmune conditions.  Hepatitis C blood testing is recommended for all people born from 1945 through 1965 and any individual with known risks for hepatitis C.  Practice safe sex. Use condoms and avoid high-risk sexual practices to reduce the spread of sexually transmitted infections (STIs). STIs include gonorrhea, chlamydia, syphilis, trichomonas, herpes, HPV, and human immunodeficiency virus (HIV). Herpes, HIV, and HPV are viral illnesses that have no cure. They can result in disability, cancer, and death.  You should be screened for sexually transmitted illnesses (STIs) including gonorrhea and chlamydia if:  You are sexually active and are younger than 24 years.  You are older than 24 years and your health care provider tells you that you are at risk for this type of infection.  Your sexual  activity has changed since you were last screened and you are at an increased risk for chlamydia or gonorrhea. Ask your health care provider if you are at risk.  If you are at risk of being infected with HIV, it is recommended that you take a prescription medicine daily to prevent HIV infection. This is called preexposure prophylaxis (PrEP). You are considered at risk if:  You are sexually active and do not regularly use condoms or know the HIV status of your partner(s).  You take drugs by injection.  You are sexually active with a partner who has HIV.  Talk with your health care provider about whether you are at high risk of being infected with HIV. If   you choose to begin PrEP, you should first be tested for HIV. You should then be tested every 3 months for as long as you are taking PrEP.  Osteoporosis is a disease in which the bones lose minerals and strength with aging. This can result in serious bone fractures or breaks. The risk of osteoporosis can be identified using a bone density scan. Women ages 1 years and over and women at risk for fractures or osteoporosis should discuss screening with their health care providers. Ask your health care provider whether you should take a calcium supplement or vitamin D to reduce the rate of osteoporosis.  Menopause can be associated with physical symptoms and risks. Hormone replacement therapy is available to decrease symptoms and risks. You should talk to your health care provider about whether hormone replacement therapy is right for you.  Use sunscreen. Apply sunscreen liberally and repeatedly throughout the day. You should seek shade when your shadow is shorter than you. Protect yourself by wearing long sleeves, pants, a wide-brimmed hat, and sunglasses year round, whenever you are outdoors.  Once a month, do a whole body skin exam, using a mirror to look at the skin on your back. Tell your health care provider of new moles, moles that have irregular  borders, moles that are larger than a pencil eraser, or moles that have changed in shape or color.  Stay current with required vaccines (immunizations).  Influenza vaccine. All adults should be immunized every year.  Tetanus, diphtheria, and acellular pertussis (Td, Tdap) vaccine. Pregnant women should receive 1 dose of Tdap vaccine during each pregnancy. The dose should be obtained regardless of the length of time since the last dose. Immunization is preferred during the 27th-36th week of gestation. An adult who has not previously received Tdap or who does not know her vaccine status should receive 1 dose of Tdap. This initial dose should be followed by tetanus and diphtheria toxoids (Td) booster doses every 10 years. Adults with an unknown or incomplete history of completing a 3-dose immunization series with Td-containing vaccines should begin or complete a primary immunization series including a Tdap dose. Adults should receive a Td booster every 10 years.  Varicella vaccine. An adult without evidence of immunity to varicella should receive 2 doses or a second dose if she has previously received 1 dose. Pregnant females who do not have evidence of immunity should receive the first dose after pregnancy. This first dose should be obtained before leaving the health care facility. The second dose should be obtained 4-8 weeks after the first dose.  Human papillomavirus (HPV) vaccine. Females aged 13-26 years who have not received the vaccine previously should obtain the 3-dose series. The vaccine is not recommended for use in pregnant females. However, pregnancy testing is not needed before receiving a dose. If a female is found to be pregnant after receiving a dose, no treatment is needed. In that case, the remaining doses should be delayed until after the pregnancy. Immunization is recommended for any person with an immunocompromised condition through the age of 24 years if she did not get any or all doses  earlier. During the 3-dose series, the second dose should be obtained 4-8 weeks after the first dose. The third dose should be obtained 24 weeks after the first dose and 16 weeks after the second dose.  Zoster vaccine. One dose is recommended for adults aged 97 years or older unless certain conditions are present.  Measles, mumps, and rubella (MMR) vaccine. Adults born  before 1957 generally are considered immune to measles and mumps. Adults born in [birth year] or later should have 1 or more doses of MMR vaccine unless there is a contraindication to the vaccine or there is laboratory evidence of immunity to each of the three diseases. A routine second dose of MMR vaccine should be obtained at least 28 days after the first dose for students attending postsecondary schools, health care workers, or international travelers. People who received inactivated measles vaccine or an unknown type of measles vaccine during 1963-1967 should receive 2 doses of MMR vaccine. People who received inactivated mumps vaccine or an unknown type of mumps vaccine before 1979 and are at high risk for mumps infection should consider immunization with 2 doses of MMR vaccine. For females of childbearing age, rubella immunity should be determined. If there is no evidence of immunity, females who are not pregnant should be vaccinated. If there is no evidence of immunity, females who are pregnant should delay immunization until after pregnancy. Unvaccinated health care workers born before 60 who lack laboratory evidence of measles, mumps, or rubella immunity or laboratory confirmation of disease should consider measles and mumps immunization with 2 doses of MMR vaccine or rubella immunization with 1 dose of MMR vaccine.  Pneumococcal 13-valent conjugate (PCV13) vaccine. When indicated, a person who is uncertain of his immunization history and has no record of immunization should receive the PCV13 vaccine. All adults 61 years of age and older  should receive this vaccine. An adult aged 92 years or older who has certain medical conditions and has not been previously immunized should receive 1 dose of PCV13 vaccine. This PCV13 should be followed with a dose of pneumococcal polysaccharide (PPSV23) vaccine. Adults who are at high risk for pneumococcal disease should obtain the PPSV23 vaccine at least 8 weeks after the dose of PCV13 vaccine. Adults older than 52 years of age who have normal immune system function should obtain the PPSV23 vaccine dose at least 1 year after the dose of PCV13 vaccine.  Pneumococcal polysaccharide (PPSV23) vaccine. When PCV13 is also indicated, PCV13 should be obtained first. All adults aged 2 years and older should be immunized. An adult younger than age 30 years who has certain medical conditions should be immunized. Any person who resides in a nursing home or long-term care facility should be immunized. An adult smoker should be immunized. People with an immunocompromised condition and certain other conditions should receive both PCV13 and PPSV23 vaccines. People with human immunodeficiency virus (HIV) infection should be immunized as soon as possible after diagnosis. Immunization during chemotherapy or radiation therapy should be avoided. Routine use of PPSV23 vaccine is not recommended for American Indians, [place] Natives, or people younger than 65 years unless there are medical conditions that require PPSV23 vaccine. When indicated, people who have unknown immunization and have no record of immunization should receive PPSV23 vaccine. One-time revaccination 5 years after the first dose of PPSV23 is recommended for people aged 19-64 years who have chronic kidney failure, nephrotic syndrome, asplenia, or immunocompromised conditions. People who received 1-2 doses of PPSV23 before age 44 years should receive another dose of PPSV23 vaccine at age 83 years or later if at least 5 years have passed since the previous dose. Doses  of PPSV23 are not needed for people immunized with PPSV23 at or after age 20 years.  Meningococcal vaccine. Adults with asplenia or persistent complement component deficiencies should receive 2 doses of quadrivalent meningococcal conjugate (MenACWY-D) vaccine. The doses should be obtained  at least 2 months apart. Microbiologists working with certain meningococcal bacteria, [place] recruits, people at risk during an outbreak, and people who travel to or live in countries with a high rate of meningitis should be immunized. A first-year college student up through age 28 years who is living in a residence hall should receive a dose if she did not receive a dose on or after her 16th birthday. Adults who have certain high-risk conditions should receive one or more doses of vaccine.  Hepatitis A vaccine. Adults who wish to be protected from this disease, have certain high-risk conditions, work with hepatitis A-infected animals, work in hepatitis A research labs, or travel to or work in countries with a high rate of hepatitis A should be immunized. Adults who were previously unvaccinated and who anticipate close contact with an international adoptee during the first 60 days after arrival in the [place] from a country with a high rate of hepatitis A should be immunized.  Hepatitis B vaccine. Adults who wish to be protected from this disease, have certain high-risk conditions, may be exposed to blood or other infectious body fluids, are household contacts or sex partners of hepatitis B positive people, are clients or workers in certain care facilities, or travel to or work in countries with a high rate of hepatitis B should be immunized.  Haemophilus influenzae type b (Hib) vaccine. A previously unvaccinated person with asplenia or sickle cell disease or having a scheduled splenectomy should receive 1 dose of Hib vaccine. Regardless of previous immunization, a recipient of a hematopoietic stem cell transplant  should receive a 3-dose series 6-12 months after her successful transplant. Hib vaccine is not recommended for adults with HIV infection. Preventive Services / Frequency Ages 71 to 87 years  Blood pressure check.** / Every 3-5 years.  Lipid and cholesterol check.** / Every 5 years beginning at age 1.  Clinical breast exam.** / Every 3 years for women in their 3s and 31s.  BRCA-related cancer risk assessment.** / For women who have family members with a BRCA-related cancer (breast, ovarian, tubal, or peritoneal cancers).  Pap test.** / Every 2 years from ages 50 through 86. Every 3 years starting at age 87 through age 7 or 75 with a history of 3 consecutive normal Pap tests.  HPV screening.** / Every 3 years from ages 59 through ages 35 to 6 with a history of 3 consecutive normal Pap tests.  Hepatitis C blood test.** / For any individual with known risks for hepatitis C.  Skin self-exam. / Monthly.  Influenza vaccine. / Every year.  Tetanus, diphtheria, and acellular pertussis (Tdap, Td) vaccine.** / Consult your health care provider. Pregnant women should receive 1 dose of Tdap vaccine during each pregnancy. 1 dose of Td every 10 years.  Varicella vaccine.** / Consult your health care provider. Pregnant females who do not have evidence of immunity should receive the first dose after pregnancy.  HPV vaccine. / 3 doses over 6 months, if 72 and younger. The vaccine is not recommended for use in pregnant females. However, pregnancy testing is not needed before receiving a dose.  Measles, mumps, rubella (MMR) vaccine.** / You need at least 1 dose of MMR if you were born in 1957 or later. You may also need a 2nd dose. For females of childbearing age, rubella immunity should be determined. If there is no evidence of immunity, females who are not pregnant should be vaccinated. If there is no evidence of immunity, females who are  pregnant should delay immunization until after  pregnancy.  Pneumococcal 13-valent conjugate (PCV13) vaccine.** / Consult your health care provider.  Pneumococcal polysaccharide (PPSV23) vaccine.** / 1 to 2 doses if you smoke cigarettes or if you have certain conditions.  Meningococcal vaccine.** / 1 dose if you are age 87 to 44 years and a Market researcher living in a residence hall, or have one of several medical conditions, you need to get vaccinated against meningococcal disease. You may also need additional booster doses.  Hepatitis A vaccine.** / Consult your health care provider.  Hepatitis B vaccine.** / Consult your health care provider.  Haemophilus influenzae type b (Hib) vaccine.** / Consult your health care provider. Ages 86 to 38 years  Blood pressure check.** / Every year.  Lipid and cholesterol check.** / Every 5 years beginning at age 49 years.  Lung cancer screening. / Every year if you are aged 71-80 years and have a 30-pack-year history of smoking and currently smoke or have quit within the past 15 years. Yearly screening is stopped once you have quit smoking for at least 15 years or develop a health problem that would prevent you from having lung cancer treatment.  Clinical breast exam.** / Every year after age 51 years.  BRCA-related cancer risk assessment.** / For women who have family members with a BRCA-related cancer (breast, ovarian, tubal, or peritoneal cancers).  Mammogram.** / Every year beginning at age 18 years and continuing for as long as you are in good health. Consult with your health care provider.  Pap test.** / Every 3 years starting at age 63 years through age 37 or 57 years with a history of 3 consecutive normal Pap tests.  HPV screening.** / Every 3 years from ages 41 years through ages 76 to 23 years with a history of 3 consecutive normal Pap tests.  Fecal occult blood test (FOBT) of stool. / Every year beginning at age 36 years and continuing until age 51 years. You may not need  to do this test if you get a colonoscopy every 10 years.  Flexible sigmoidoscopy or colonoscopy.** / Every 5 years for a flexible sigmoidoscopy or every 10 years for a colonoscopy beginning at age 36 years and continuing until age 35 years.  Hepatitis C blood test.** / For all people born from [birth year] through 1967 and any individual with known risks for hepatitis C.  Skin self-exam. / Monthly.  Influenza vaccine. / Every year.  Tetanus, diphtheria, and acellular pertussis (Tdap/Td) vaccine.** / Consult your health care provider. Pregnant women should receive 1 dose of Tdap vaccine during each pregnancy. 1 dose of Td every 10 years.  Varicella vaccine.** / Consult your health care provider. Pregnant females who do not have evidence of immunity should receive the first dose after pregnancy.  Zoster vaccine.** / 1 dose for adults aged 73 years or older.  Measles, mumps, rubella (MMR) vaccine.** / You need at least 1 dose of MMR if you were born in 1957 or later. You may also need a second dose. For females of childbearing age, rubella immunity should be determined. If there is no evidence of immunity, females who are not pregnant should be vaccinated. If there is no evidence of immunity, females who are pregnant should delay immunization until after pregnancy.  Pneumococcal 13-valent conjugate (PCV13) vaccine.** / Consult your health care provider.  Pneumococcal polysaccharide (PPSV23) vaccine.** / 1 to 2 doses if you smoke cigarettes or if you have certain conditions.  Meningococcal vaccine.** /  Consult your health care provider.  Hepatitis A vaccine.** / Consult your health care provider.  Hepatitis B vaccine.** / Consult your health care provider.  Haemophilus influenzae type b (Hib) vaccine.** / Consult your health care provider. Ages 80 years and over  Blood pressure check.** / Every year.  Lipid and cholesterol check.** / Every 5 years beginning at age 62 years.  Lung cancer  screening. / Every year if you are aged 32-80 years and have a 30-pack-year history of smoking and currently smoke or have quit within the past 15 years. Yearly screening is stopped once you have quit smoking for at least 15 years or develop a health problem that would prevent you from having lung cancer treatment.  Clinical breast exam.** / Every year after age 61 years.  BRCA-related cancer risk assessment.** / For women who have family members with a BRCA-related cancer (breast, ovarian, tubal, or peritoneal cancers).  Mammogram.** / Every year beginning at age 39 years and continuing for as long as you are in good health. Consult with your health care provider.  Pap test.** / Every 3 years starting at age 85 years through age 74 or 72 years with 3 consecutive normal Pap tests. Testing can be stopped between 65 and 70 years with 3 consecutive normal Pap tests and no abnormal Pap or HPV tests in the past 10 years.  HPV screening.** / Every 3 years from ages 55 years through ages 67 or 77 years with a history of 3 consecutive normal Pap tests. Testing can be stopped between 65 and 70 years with 3 consecutive normal Pap tests and no abnormal Pap or HPV tests in the past 10 years.  Fecal occult blood test (FOBT) of stool. / Every year beginning at age 81 years and continuing until age 22 years. You may not need to do this test if you get a colonoscopy every 10 years.  Flexible sigmoidoscopy or colonoscopy.** / Every 5 years for a flexible sigmoidoscopy or every 10 years for a colonoscopy beginning at age 67 years and continuing until age 22 years.  Hepatitis C blood test.** / For all people born from [birth year] through 1967 and any individual with known risks for hepatitis C.  Osteoporosis screening.** / A one-time screening for women ages 8 years and over and women at risk for fractures or osteoporosis.  Skin self-exam. / Monthly.  Influenza vaccine. / Every year.  Tetanus, diphtheria, and  acellular pertussis (Tdap/Td) vaccine.** / 1 dose of Td every 10 years.  Varicella vaccine.** / Consult your health care provider.  Zoster vaccine.** / 1 dose for adults aged 56 years or older.  Pneumococcal 13-valent conjugate (PCV13) vaccine.** / Consult your health care provider.  Pneumococcal polysaccharide (PPSV23) vaccine.** / 1 dose for all adults aged 15 years and older.  Meningococcal vaccine.** / Consult your health care provider.  Hepatitis A vaccine.** / Consult your health care provider.  Hepatitis B vaccine.** / Consult your health care provider.  Haemophilus influenzae type b (Hib) vaccine.** / Consult your health care provider. ** Family history and personal history of risk and conditions may change your health care provider's recommendations.   This information is not intended to replace advice given to you by your health care provider. Make sure you discuss any questions you have with your health care provider.   Document Released: 10/24/2001 Document Revised: 09/18/2014 Document Reviewed: 01/23/2011 Elsevier Interactive Patient Education [employer].

## 2016-01-13 ENCOUNTER — Telehealth: Payer: Self-pay | Admitting: *Deleted

## 2016-01-13 DIAGNOSIS — Z7689 Persons encountering health services in other specified circumstances: Secondary | ICD-10-CM

## 2016-01-13 LAB — COMPREHENSIVE METABOLIC PANEL
A/G RATIO: 1.8 (ref 1.2–2.2)
ALT: 9 IU/L (ref 0–32)
AST: 16 IU/L (ref 0–40)
Albumin: 4.6 g/dL (ref 3.5–5.5)
Alkaline Phosphatase: 68 IU/L (ref 39–117)
BUN / CREAT RATIO: 15 (ref 9–23)
BUN: 11 mg/dL (ref 6–24)
CHLORIDE: 101 mmol/L (ref 96–106)
CO2: 23 mmol/L (ref 18–29)
Calcium: 9.5 mg/dL (ref 8.7–10.2)
Creatinine, Ser: 0.71 mg/dL (ref 0.57–1.00)
GFR calc non Af Amer: 99 mL/min/{1.73_m2} (ref >59)
GFR, EST AFRICAN AMERICAN: 114 mL/min/{1.73_m2} (ref >59)
Globulin, Total: 2.5 g/dL (ref 1.5–4.5)
Glucose: 78 mg/dL (ref 65–99)
POTASSIUM: 4.1 mmol/L (ref 3.5–5.2)
SODIUM: 141 mmol/L (ref 134–144)
TOTAL PROTEIN: 7.1 g/dL (ref 6.0–8.5)

## 2016-01-13 LAB — TSH: TSH: 1.67 u[IU]/mL (ref 0.450–4.500)

## 2016-01-13 LAB — HEMOGLOBIN A1C
Est. average glucose Bld gHb Est-mCnc: 114 mg/dL
Hgb A1c MFr Bld: 5.6 % (ref 4.8–5.6)

## 2016-01-13 LAB — LIPID PANEL
CHOLESTEROL TOTAL: 183 mg/dL (ref 100–199)
Chol/HDL Ratio: 3.3 ratio units (ref 0.0–4.4)
HDL: 56 mg/dL (ref >39)
LDL CALC: 101 mg/dL — AB (ref 0–99)
TRIGLYCERIDES: 132 mg/dL (ref 0–149)
VLDL CHOLESTEROL CAL: 26 mg/dL (ref 5–40)

## 2016-01-13 LAB — CBC
Hematocrit: 30.2 % — ABNORMAL LOW (ref 34.0–46.6)
Hemoglobin: 8.9 g/dL — ABNORMAL LOW (ref 11.1–15.9)
MCH: 21.8 pg — AB (ref 26.6–33.0)
MCHC: 29.5 g/dL — ABNORMAL LOW (ref 31.5–35.7)
MCV: 74 fL — AB (ref 79–97)
PLATELETS: 225 10*3/uL (ref 150–379)
RBC: 4.08 x10E6/uL (ref 3.77–5.28)
RDW: 16.4 % — AB (ref 12.3–15.4)
WBC: 5.7 10*3/uL (ref 3.4–10.8)

## 2016-01-13 LAB — VITAMIN D 25 HYDROXY (VIT D DEFICIENCY, FRACTURES): VIT D 25 HYDROXY: 19.8 ng/mL — AB (ref 30.0–100.0)

## 2016-01-13 NOTE — Telephone Encounter (Signed)
Called pt no answer. LM for pt informing her to take Tylenol or motrin for headache and to seek care at urgent care for tick bite. Placed referral for pt to have PCP

## 2016-01-13 NOTE — Telephone Encounter (Signed)
Patient stated she been having headaches for 3 days. Patient didn't mention it at her visit with the Dr yesterday. She thought it was due to menopause  She noticed this morning that she had a tick on her back. She was wondering if that could be causing her to have headaches. She is requesting a call back. Her call back number is [phone].

## 2016-01-17 ENCOUNTER — Encounter: Payer: Self-pay | Admitting: Obstetrics and Gynecology

## 2016-01-17 DIAGNOSIS — N941 Unspecified dyspareunia: Secondary | ICD-10-CM | POA: Insufficient documentation

## 2016-01-17 DIAGNOSIS — N951 Menopausal and female climacteric states: Secondary | ICD-10-CM | POA: Insufficient documentation

## 2016-01-17 DIAGNOSIS — R928 Other abnormal and inconclusive findings on diagnostic imaging of breast: Secondary | ICD-10-CM | POA: Insufficient documentation

## 2016-01-21 ENCOUNTER — Ambulatory Visit
Admission: RE | Admit: 2016-01-21 | Discharge: 2016-01-21 | Disposition: A | Discharge status: Home / Self Care | Payer: 59 | Source: Ambulatory Visit | Attending: Obstetrics and Gynecology | Admitting: Obstetrics and Gynecology

## 2016-01-21 ENCOUNTER — Other Ambulatory Visit: Payer: Self-pay | Admitting: Obstetrics and Gynecology

## 2016-01-21 DIAGNOSIS — R928 Other abnormal and inconclusive findings on diagnostic imaging of breast: Secondary | ICD-10-CM

## 2016-01-21 DIAGNOSIS — R921 Mammographic calcification found on diagnostic imaging of breast: Secondary | ICD-10-CM | POA: Insufficient documentation

## 2016-02-18 ENCOUNTER — Emergency Department
Admission: EM | Admit: 2016-02-18 | Discharge: 2016-02-18 | Disposition: A | Discharge status: Home / Self Care | Payer: 59 | Attending: Emergency Medicine | Admitting: Emergency Medicine

## 2016-02-18 ENCOUNTER — Emergency Department: Payer: 59

## 2016-02-18 ENCOUNTER — Encounter: Payer: Self-pay | Admitting: Emergency Medicine

## 2016-02-18 DIAGNOSIS — M542 Cervicalgia: Secondary | ICD-10-CM | POA: Diagnosis present

## 2016-02-18 DIAGNOSIS — G44329 Chronic post-traumatic headache, not intractable: Secondary | ICD-10-CM | POA: Diagnosis not present

## 2016-02-18 DIAGNOSIS — F17200 Nicotine dependence, unspecified, uncomplicated: Secondary | ICD-10-CM | POA: Insufficient documentation

## 2016-02-18 DIAGNOSIS — Y939 Activity, unspecified: Secondary | ICD-10-CM | POA: Diagnosis not present

## 2016-02-18 DIAGNOSIS — Y999 Unspecified external cause status: Secondary | ICD-10-CM | POA: Insufficient documentation

## 2016-02-18 DIAGNOSIS — Z8589 Personal history of malignant neoplasm of other organs and systems: Secondary | ICD-10-CM | POA: Diagnosis not present

## 2016-02-18 DIAGNOSIS — Y92410 Unspecified street and highway as the place of occurrence of the external cause: Secondary | ICD-10-CM | POA: Insufficient documentation

## 2016-02-18 LAB — CBC WITH DIFFERENTIAL/PLATELET
BASOS ABS: 0.1 10*3/uL (ref 0–0.1)
BASOS PCT: 1 %
EOS ABS: 0.1 10*3/uL (ref 0–0.7)
Eosinophils Relative: 1 %
HCT: 32.9 % — ABNORMAL LOW (ref 35.0–47.0)
HEMOGLOBIN: 10.2 g/dL — AB (ref 12.0–16.0)
Lymphocytes Relative: 32 %
Lymphs Abs: 2.2 10*3/uL (ref 1.0–3.6)
MCH: 22.2 pg — ABNORMAL LOW (ref 26.0–34.0)
MCHC: 30.8 g/dL — ABNORMAL LOW (ref 32.0–36.0)
MCV: 71.9 fL — ABNORMAL LOW (ref 80.0–100.0)
Monocytes Absolute: 0.6 10*3/uL (ref 0.2–0.9)
Monocytes Relative: 9 %
NEUTROS PCT: 57 %
Neutro Abs: 3.8 10*3/uL (ref 1.4–6.5)
Platelets: 258 10*3/uL (ref 150–440)
RBC: 4.58 MIL/uL (ref 3.80–5.20)
RDW: 17.6 % — ABNORMAL HIGH (ref 11.5–14.5)
WBC: 6.7 10*3/uL (ref 3.6–11.0)

## 2016-02-18 LAB — URINALYSIS COMPLETE WITH MICROSCOPIC (ARMC ONLY)
BACTERIA UA: NONE SEEN
Bilirubin Urine: NEGATIVE
Glucose, UA: NEGATIVE mg/dL
HGB URINE DIPSTICK: NEGATIVE
Ketones, ur: NEGATIVE mg/dL
LEUKOCYTES UA: NEGATIVE
NITRITE: NEGATIVE
PH: 6 (ref 5.0–8.0)
Protein, ur: NEGATIVE mg/dL
RBC / HPF: NONE SEEN RBC/hpf (ref 0–5)
SQUAMOUS EPITHELIAL / LPF: NONE SEEN
Specific Gravity, Urine: 1.004 — ABNORMAL LOW (ref 1.005–1.030)

## 2016-02-18 LAB — COMPREHENSIVE METABOLIC PANEL
ALBUMIN: 4.6 g/dL (ref 3.5–5.0)
ALK PHOS: 67 U/L (ref 38–126)
ALT: 11 U/L — AB (ref 14–54)
ANION GAP: 7 (ref 5–15)
AST: 25 U/L (ref 15–41)
BUN: 13 mg/dL (ref 6–20)
CALCIUM: 10 mg/dL (ref 8.9–10.3)
CO2: 25 mmol/L (ref 22–32)
CREATININE: 0.79 mg/dL (ref 0.44–1.00)
Chloride: 108 mmol/L (ref 101–111)
GFR calc Af Amer: >60 mL/min (ref >60)
GFR calc non Af Amer: >60 mL/min (ref >60)
GLUCOSE: 71 mg/dL (ref 65–99)
Potassium: 4 mmol/L (ref 3.5–5.1)
SODIUM: 140 mmol/L (ref 135–145)
Total Bilirubin: 0.4 mg/dL (ref 0.3–1.2)
Total Protein: 8.4 g/dL — ABNORMAL HIGH (ref 6.5–8.1)

## 2016-02-18 MED ORDER — NAPROXEN 500 MG PO TABS: 500.0000 mg | ORAL_TABLET | Freq: Two times a day (BID) | ORAL | Status: DC
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
## 2016-03-16 ENCOUNTER — Ambulatory Visit: Payer: 59 | Admitting: Obstetrics and Gynecology

## 2016-04-11 ENCOUNTER — Ambulatory Visit: Payer: 59 | Admitting: Obstetrics and Gynecology
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
## 2016-10-07 ENCOUNTER — Emergency Department
Admission: EM | Admit: 2016-10-07 | Discharge: 2016-10-07 | Disposition: A | Discharge status: Home / Self Care | Payer: 59 | Attending: Emergency Medicine | Admitting: Emergency Medicine

## 2016-10-07 ENCOUNTER — Encounter: Payer: Self-pay | Admitting: *Deleted

## 2016-10-07 DIAGNOSIS — R300 Dysuria: Secondary | ICD-10-CM | POA: Diagnosis present

## 2016-10-07 DIAGNOSIS — Z791 Long term (current) use of non-steroidal anti-inflammatories (NSAID): Secondary | ICD-10-CM | POA: Diagnosis not present

## 2016-10-07 DIAGNOSIS — M62830 Muscle spasm of back: Secondary | ICD-10-CM | POA: Insufficient documentation

## 2016-10-07 DIAGNOSIS — N3090 Cystitis, unspecified without hematuria: Secondary | ICD-10-CM | POA: Diagnosis not present

## 2016-10-07 DIAGNOSIS — F17200 Nicotine dependence, unspecified, uncomplicated: Secondary | ICD-10-CM | POA: Diagnosis not present

## 2016-10-07 DIAGNOSIS — Z87440 Personal history of urinary (tract) infections: Secondary | ICD-10-CM | POA: Diagnosis not present

## 2016-10-07 LAB — URINALYSIS, COMPLETE (UACMP) WITH MICROSCOPIC
BILIRUBIN URINE: NEGATIVE
Bacteria, UA: NONE SEEN
GLUCOSE, UA: NEGATIVE mg/dL
HGB URINE DIPSTICK: NEGATIVE
KETONES UR: 5 mg/dL — AB
Leukocytes, UA: NEGATIVE
NITRITE: NEGATIVE
PH: 5 (ref 5.0–8.0)
PROTEIN: NEGATIVE mg/dL
RBC / HPF: NONE SEEN RBC/hpf (ref 0–5)
Specific Gravity, Urine: 1.02 (ref 1.005–1.030)

## 2016-10-07 MED ORDER — SULFAMETHOXAZOLE-TRIMETHOPRIM 800-160 MG PO TABS: 1.0000 | ORAL_TABLET | Freq: Two times a day (BID) | ORAL | 0 refills | Status: DC

## 2016-10-07 MED ORDER — CYCLOBENZAPRINE HCL 5 MG PO TABS: 5.0000 mg | ORAL_TABLET | Freq: Three times a day (TID) | ORAL | 0 refills | Status: DC | PRN

## 2016-10-07 MED ORDER — PHENAZOPYRIDINE HCL 100 MG PO TABS: 100.0000 mg | ORAL_TABLET | Freq: Three times a day (TID) | ORAL | 0 refills | Status: DC | PRN

## 2016-10-07 MED ORDER — FLUCONAZOLE 150 MG PO TABS: 150.0000 mg | ORAL_TABLET | ORAL | 0 refills | Status: AC

## 2016-10-07 NOTE — ED Provider Notes (Signed)
[facility] Emergency Department Provider Note  ____________________________________________  Time seen: Approximately 3:56 PM  I have reviewed the triage vital signs and the nursing notes.   HISTORY  Chief Complaint Dysuria and Back Pain    HPI Yesenia Flores is a 51 y.o. female , NAD, presents to the emergency department for evaluation of dysuria, increased urinary frequency and right mid back pain. Patient states she was seen at a local medical clinic for the same symptoms. States he completed a urinalysis and suggested that the patient present to the emergency department for evaluation of pyelonephritis. He has a history of bladder and kidney infections in the past in which she usually gets them one time per year. Last bladder/kidney infection was approximately one year ago and was treated on an outpatient basis. States that typically her bladder infections begin with back ache, increased urinary frequency as well as dysuria. Has not seen any blood in her urine. Denies any pelvic pain, vaginal discharge or bleeding. States she has not strained her back nor change any activities to cause any back pain. Has not noted any rashes or skin sores. Denies fevers, chills, body aches, chest pain, shortness breath, abdominal pain, nausea, vomiting or diarrhea.   Past Medical History:  Diagnosis Date  . Anemia   . Bladder infection 2013  . Breast screening, unspecified 2013  . H/O CT scan of chest 2012  . 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 Other malaise and fatigue 2013  . Other sign and symptom in breast 2013  . Rash and other nonspecific skin eruption 2013    Patient Active Problem List   Diagnosis Date Noted  . Perimenopausal symptoms 01/17/2016  . Abnormal mammogram of left breast 01/17/2016  . Dyspareunia in female 01/17/2016  . 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 H/O CT scan of chest     Past Surgical  History:  Procedure Laterality Date  . BREAST CYST ASPIRATION Right 2014   NEG  . CESAREAN SECTION  [id number]  . TUBAL LIGATION Bilateral 2013    Prior to Admission medications   Medication Sig Start Date End Date Taking? Authorizing Provider  cyclobenzaprine (FLEXERIL) 5 MG tablet Take 1 tablet (5 mg total) by mouth 3 (three) times daily as needed for muscle spasms. 10/07/16   Bernie Fobes L Fallynn Gravett, PA-C  fluconazole (DIFLUCAN) 150 MG tablet Take 1 tablet (150 mg total) by mouth as directed. Tale one tablet today then another in 3 days 10/07/16 10/14/16  Matsuko Kretz L Brooklyn Jeff, PA-C  naproxen (NAPROSYN) 500 MG tablet Take 1 tablet (500 mg total) by mouth 2 (two) times daily with a meal. 02/18/16   Johnn Hai, PA-C  phenazopyridine (PYRIDIUM) 100 MG tablet Take 1 tablet (100 mg total) by mouth 3 (three) times daily as needed for pain (May take 1-2 as needed three times daily). 10/07/16   Reiss Mowrey L Timara Loma, PA-C  sulfamethoxazole-trimethoprim (BACTRIM DS,SEPTRA DS) 800-160 MG tablet Take 1 tablet by mouth 2 (two) times daily. 10/07/16   Starsha Morning L Roseanna Koplin, PA-C    Allergies Patient has no known allergies.  Family History  Problem Relation Age of Onset  . Thyroid disease Mother   . Breast cancer Maternal Aunt     Social History Social History  Substance Use Topics  . Smoking status: Light Tobacco Smoker  . Smokeless tobacco: Never Used  . Alcohol use No     Review of Systems  Constitutional: No fever/chills,  fatigue Cardiovascular: No chest pain. Respiratory: No shortness of breath. Gastrointestinal: No abdominal pain.  No nausea, vomiting.   Genitourinary: Positive for dysuria and increased urinary frequency. No hematuria, vaginal discharge, vaginal bleeding, pelvic pain. No urinary hesitancy, urgency. Musculoskeletal: Positive for right middle back pain.  Skin: Negative for rash, redness, swelling, bruising, skin sores. Neurological: Negative for numbness, weakness, tingling. 10-point ROS otherwise  negative.  ____________________________________________   PHYSICAL EXAM:  VITAL SIGNS: ED Triage Vitals  Enc Vitals Group     BP 10/07/16 1523 140/78     Pulse Rate 10/07/16 1523 69     Resp 10/07/16 1523 16     Temp 10/07/16 1523 98.3 F (36.8 C)     Temp Source 10/07/16 1523 Oral     SpO2 10/07/16 1523 100 %     Weight 10/07/16 1524 158 lb (71.7 kg)     Height 10/07/16 1524 5\' 4"  (1.626 m)     Head Circumference --      Peak Flow --      Pain Score 10/07/16 1524 3     Pain Loc --      Pain Edu? --      Excl. in [place]? --      Constitutional: Alert and oriented. Well appearing and in no acute distress. Eyes: Conjunctivae are normal Without icterus, injection or discharge. Head: Atraumatic. Neck: Supple with full range of motion. Hematological/Lymphatic/Immunilogical: No cervical lymphadenopathy. Cardiovascular: Normal rate, regular rhythm. Normal S1 and S2.  Good peripheral circulation. Respiratory: Normal respiratory effort without tachypnea or retractions. Lungs CTAB with breath sounds noted in all lung fields. No wheeze, rhonchi, rales. Gastrointestinal: Mild tenderness to deep palpation about the suprapubic region without distention or guarding and no tenderness in the right or left lower quadrants. No rebound or rigidity. No masses. All other quadrants of the abdomen are soft without tenderness or distention. No CVA tenderness. Bowel sounds grossly normal active in all quadrants. Musculoskeletal: No midline tenderness of the thoracic or lumbar regions. Tenderness about the musculature about the right lateral lower thoracic region with trace muscle spasm. Full range of motion of the lumbar spine without pain or difficulty. Neurologic:  Normal speech and language. No gross focal neurologic deficits are appreciated.  Skin:  Skin is warm, dry and intact. No rash, redness, swelling, skin sores noted. Psychiatric: Mood and affect are normal. Speech and behavior are normal. Patient  exhibits appropriate insight and judgement.   ____________________________________________   LABS (all labs ordered are listed, but only abnormal results are displayed)  Labs Reviewed  URINALYSIS, COMPLETE (UACMP) WITH MICROSCOPIC - Abnormal; Notable for the following:       Result Value   Color, Urine YELLOW (*)    APPearance CLEAR (*)    Ketones, ur 5 (*)    Squamous Epithelial / LPF 0-5 (*)    All other components within normal limits  URINE CULTURE   ____________________________________________  EKG  None ____________________________________________  RADIOLOGY  None ____________________________________________    PROCEDURES  Procedure(s) performed: None   Procedures   Medications - No data to display   ____________________________________________   INITIAL IMPRESSION / ASSESSMENT AND PLAN / ED COURSE  Pertinent labs & imaging results that were available during my care of the patient were reviewed by me and considered in my medical decision making (see chart for details).     Patient's diagnosis is consistent with cystitis, muscle spasm of the back and history of UTIs. Long discussion had with the patient  were reversed to her symptoms and presentation. Patient seems to be reliable and notes that her current symptoms are precursor symptoms for previous UTIs. Discussed that there is no evidence of bacterial infection in the urine at this time but we'll go ahead and start antibiotics while urine culture is being processed. Also discussed with the patient has a mild muscle spasm about the mid thoracic area in which I believe is causing most of her pain in which we will treat with muscle relaxers. Patient will be discharged home with prescriptions for Bactrim DS, Pyridium, Flexeril and fluconazole to take as directed. Patient is to follow up with her PCP or [facility] if symptoms persist past this treatment course. Patient is given ED precautions to return  to the ED for any worsening or new symptoms.    ____________________________________________  FINAL CLINICAL IMPRESSION(S) / ED DIAGNOSES  Final diagnoses:  Cystitis  Muscle spasm of back  History of UTI      NEW MEDICATIONS STARTED DURING THIS VISIT:  New Prescriptions   CYCLOBENZAPRINE (FLEXERIL) 5 MG TABLET    Take 1 tablet (5 mg total) by mouth 3 (three) times daily as needed for muscle spasms.   FLUCONAZOLE (DIFLUCAN) 150 MG TABLET    Take 1 tablet (150 mg total) by mouth as directed. Tale one tablet today then another in 3 days   PHENAZOPYRIDINE (PYRIDIUM) 100 MG TABLET    Take 1 tablet (100 mg total) by mouth 3 (three) times daily as needed for pain (May take 1-2 as needed three times daily).   SULFAMETHOXAZOLE-TRIMETHOPRIM (BACTRIM DS,SEPTRA DS) 800-160 MG TABLET    Take 1 tablet by mouth 2 (two) times daily.         Braxton Feathers, PA-C 10/07/16 1652    Earleen Newport, MD 10/07/16 [phone]

## 2016-10-07 NOTE — ED Triage Notes (Addendum)
PT arrived to [facility] from [facility]. Pt was sent to rule out "kidney infection" per pt. PTs paperwork shows that a urinalysis was completed that showed trace amounts of protein in urine but no signs of leukocytes or nitrites. Pt reports burning with urination and while sitting in triage. No fevers reported but pt also verbalized increasing lower back pain over the past day. No blood in urine. Hx of kidney stones but pt verbalized this does not feel the same as in the past.

## 2016-10-07 NOTE — ED Notes (Signed)
Pt verbalized understanding of discharge instructions. NAD at this time. 

## 2016-10-08 LAB — URINE CULTURE: SPECIAL REQUESTS: NORMAL

## 2017-02-06 ENCOUNTER — Other Ambulatory Visit: Payer: Self-pay | Admitting: Obstetrics and Gynecology

## 2017-03-27 DIAGNOSIS — R0789 Other chest pain: Secondary | ICD-10-CM | POA: Diagnosis not present

## 2017-03-27 DIAGNOSIS — R222 Localized swelling, mass and lump, trunk: Secondary | ICD-10-CM | POA: Diagnosis not present

## 2017-03-27 DIAGNOSIS — R0600 Dyspnea, unspecified: Secondary | ICD-10-CM | POA: Diagnosis not present

## 2017-03-30 ENCOUNTER — Other Ambulatory Visit: Payer: Self-pay | Admitting: Internal Medicine

## 2017-03-30 ENCOUNTER — Other Ambulatory Visit: Payer: Self-pay | Admitting: Family Medicine

## 2017-04-04 ENCOUNTER — Other Ambulatory Visit: Payer: Self-pay | Admitting: Family Medicine

## 2017-04-04 DIAGNOSIS — Z1231 Encounter for screening mammogram for malignant neoplasm of breast: Secondary | ICD-10-CM

## 2017-04-17 ENCOUNTER — Ambulatory Visit
Admission: RE | Admit: 2017-04-17 | Discharge: 2017-04-17 | Disposition: A | Discharge status: Home / Self Care | Payer: 59 | Source: Ambulatory Visit | Attending: Family Medicine | Admitting: Family Medicine

## 2017-04-17 DIAGNOSIS — R928 Other abnormal and inconclusive findings on diagnostic imaging of breast: Secondary | ICD-10-CM | POA: Insufficient documentation

## 2017-04-17 DIAGNOSIS — N6489 Other specified disorders of breast: Secondary | ICD-10-CM | POA: Diagnosis not present

## 2017-04-17 DIAGNOSIS — Z1231 Encounter for screening mammogram for malignant neoplasm of breast: Secondary | ICD-10-CM | POA: Diagnosis not present

## 2017-04-25 ENCOUNTER — Other Ambulatory Visit: Payer: Self-pay | Admitting: Family Medicine

## 2017-04-25 DIAGNOSIS — R928 Other abnormal and inconclusive findings on diagnostic imaging of breast: Secondary | ICD-10-CM

## 2017-05-03 ENCOUNTER — Ambulatory Visit
Admission: RE | Admit: 2017-05-03 | Discharge: 2017-05-03 | Disposition: A | Discharge status: Home / Self Care | Payer: 59 | Source: Ambulatory Visit | Attending: Family Medicine | Admitting: Family Medicine

## 2017-05-03 DIAGNOSIS — R928 Other abnormal and inconclusive findings on diagnostic imaging of breast: Secondary | ICD-10-CM

## 2017-10-01 ENCOUNTER — Other Ambulatory Visit: Payer: Self-pay | Admitting: Family Medicine

## 2017-10-01 DIAGNOSIS — R928 Other abnormal and inconclusive findings on diagnostic imaging of breast: Secondary | ICD-10-CM

## 2017-11-06 ENCOUNTER — Ambulatory Visit
Admission: RE | Admit: 2017-11-06 | Discharge: 2017-11-06 | Disposition: A | Discharge status: Home / Self Care | Payer: 59 | Source: Ambulatory Visit | Attending: Family Medicine | Admitting: Family Medicine

## 2017-11-06 DIAGNOSIS — R928 Other abnormal and inconclusive findings on diagnostic imaging of breast: Secondary | ICD-10-CM

## 2017-11-06 DIAGNOSIS — R922 Inconclusive mammogram: Secondary | ICD-10-CM | POA: Diagnosis not present

## 2018-02-21 ENCOUNTER — Ambulatory Visit (INDEPENDENT_AMBULATORY_CARE_PROVIDER_SITE_OTHER): Payer: 59 | Admitting: Obstetrics and Gynecology

## 2018-02-21 ENCOUNTER — Encounter: Payer: Self-pay | Admitting: Obstetrics and Gynecology

## 2018-02-21 VITALS — BP 110/68 | HR 83 | Ht 65.0 in | Wt 172.9 lb

## 2018-02-21 DIAGNOSIS — R5383 Other fatigue: Secondary | ICD-10-CM

## 2018-02-21 DIAGNOSIS — N952 Postmenopausal atrophic vaginitis: Secondary | ICD-10-CM | POA: Diagnosis not present

## 2018-02-21 DIAGNOSIS — Z124 Encounter for screening for malignant neoplasm of cervix: Secondary | ICD-10-CM

## 2018-02-21 DIAGNOSIS — Z13220 Encounter for screening for lipoid disorders: Secondary | ICD-10-CM

## 2018-02-21 DIAGNOSIS — R102 Pelvic and perineal pain: Secondary | ICD-10-CM

## 2018-02-21 DIAGNOSIS — N898 Other specified noninflammatory disorders of vagina: Secondary | ICD-10-CM | POA: Diagnosis not present

## 2018-02-21 DIAGNOSIS — Z01419 Encounter for gynecological examination (general) (routine) without abnormal findings: Secondary | ICD-10-CM

## 2018-02-21 DIAGNOSIS — N941 Unspecified dyspareunia: Secondary | ICD-10-CM

## 2018-02-21 NOTE — Patient Instructions (Signed)
Health Maintenance for Postmenopausal Women Menopause is a normal process in which your reproductive ability comes to an end. This process happens gradually over a span of months to years, usually between the ages of 22 and 9. Menopause is complete when you have missed 12 consecutive menstrual periods. It is important to talk with your health care provider about some of the most common conditions that affect postmenopausal women, such as heart disease, cancer, and bone loss (osteoporosis). Adopting a healthy lifestyle and getting preventive care can help to promote your health and wellness. Those actions can also lower your chances of developing some of these common conditions. What should I know about menopause? During menopause, you may experience a number of symptoms, such as:  Moderate-to-severe hot flashes.  Night sweats.  Decrease in sex drive.  Mood swings.  Headaches.  Tiredness.  Irritability.  Memory problems.  Insomnia.  Choosing to treat or not to treat menopausal changes is an individual decision that you make with your health care provider. What should I know about hormone replacement therapy and supplements? Hormone therapy products are effective for treating symptoms that are associated with menopause, such as hot flashes and night sweats. Hormone replacement carries certain risks, especially as you become older. If you are thinking about using estrogen or estrogen with progestin treatments, discuss the benefits and risks with your health care provider. What should I know about heart disease and stroke? Heart disease, heart attack, and stroke become more likely as you age. This may be due, in part, to the hormonal changes that your body experiences during menopause. These can affect how your body processes dietary fats, triglycerides, and cholesterol. Heart attack and stroke are both medical emergencies. There are many things that you can do to help prevent heart disease  and stroke:  Have your blood pressure checked at least every 1-2 years. High blood pressure causes heart disease and increases the risk of stroke.  If you are 53-22 years old, ask your health care provider if you should take aspirin to prevent a heart attack or a stroke.  Do not use any tobacco products, including cigarettes, chewing tobacco, or electronic cigarettes. If you need help quitting, ask your health care provider.  It is important to eat a healthy diet and maintain a healthy weight. ? Be sure to include plenty of vegetables, fruits, low-fat dairy products, and lean protein. ? Avoid eating foods that are high in solid fats, added sugars, or salt (sodium).  Get regular exercise. This is one of the most important things that you can do for your health. ? Try to exercise for at least 150 minutes each week. The type of exercise that you do should increase your heart rate and make you sweat. This is known as moderate-intensity exercise. ? Try to do strengthening exercises at least twice each week. Do these in addition to the moderate-intensity exercise.  Know your numbers.Ask your health care provider to check your cholesterol and your blood glucose. Continue to have your blood tested as directed by your health care provider.  What should I know about cancer screening? There are several types of cancer. Take the following steps to reduce your risk and to catch any cancer development as early as possible. Breast Cancer  Practice breast self-awareness. ? This means understanding how your breasts normally appear and feel. ? It also means doing regular breast self-exams. Let your health care provider know about any changes, no matter how small.  If you are 40  or older, have a clinician do a breast exam (clinical breast exam or CBE) every year. Depending on your age, family history, and medical history, it may be recommended that you also have a yearly breast X-ray (mammogram).  If you  have a family history of breast cancer, talk with your health care provider about genetic screening.  If you are at high risk for breast cancer, talk with your health care provider about having an MRI and a mammogram every year.  Breast cancer (BRCA) gene test is recommended for women who have family members with BRCA-related cancers. Results of the assessment will determine the need for genetic counseling and BRCA1 and for BRCA2 testing. BRCA-related cancers include these types: ? Breast. This occurs in males or females. ? Ovarian. ? Tubal. This may also be called fallopian tube cancer. ? Cancer of the abdominal or pelvic lining (peritoneal cancer). ? Prostate. ? Pancreatic.  Cervical, Uterine, and Ovarian Cancer Your health care provider may recommend that you be screened regularly for cancer of the pelvic organs. These include your ovaries, uterus, and vagina. This screening involves a pelvic exam, which includes checking for microscopic changes to the surface of your cervix (Pap test).  For women ages 21-65, health care providers may recommend a pelvic exam and a Pap test every three years. For women ages 79-65, they may recommend the Pap test and pelvic exam, combined with testing for human papilloma virus (HPV), every five years. Some types of HPV increase your risk of cervical cancer. Testing for HPV may also be done on women of any age who have unclear Pap test results.  Other health care providers may not recommend any screening for nonpregnant women who are considered low risk for pelvic cancer and have no symptoms. Ask your health care provider if a screening pelvic exam is right for you.  If you have had past treatment for cervical cancer or a condition that could lead to cancer, you need Pap tests and screening for cancer for at least 20 years after your treatment. If Pap tests have been discontinued for you, your risk factors (such as having a new sexual partner) need to be  reassessed to determine if you should start having screenings again. Some women have medical problems that increase the chance of getting cervical cancer. In these cases, your health care provider may recommend that you have screening and Pap tests more often.  If you have a family history of uterine cancer or ovarian cancer, talk with your health care provider about genetic screening.  If you have vaginal bleeding after reaching menopause, tell your health care provider.  There are currently no reliable tests available to screen for ovarian cancer.  Lung Cancer Lung cancer screening is recommended for adults 69-62 years old who are at high risk for lung cancer because of a history of smoking. A yearly low-dose CT scan of the lungs is recommended if you:  Currently smoke.  Have a history of at least 30 pack-years of smoking and you currently smoke or have quit within the past 15 years. A pack-year is smoking an average of one pack of cigarettes per day for one year.  Yearly screening should:  Continue until it has been 15 years since you quit.  Stop if you develop a health problem that would prevent you from having lung cancer treatment.  Colorectal Cancer  This type of cancer can be detected and can often be prevented.  Routine colorectal cancer screening usually begins at  age 42 and continues through age 45.  If you have risk factors for colon cancer, your health care provider may recommend that you be screened at an earlier age.  If you have a family history of colorectal cancer, talk with your health care provider about genetic screening.  Your health care provider may also recommend using home test kits to check for hidden blood in your stool.  A small camera at the end of a tube can be used to examine your colon directly (sigmoidoscopy or colonoscopy). This is done to check for the earliest forms of colorectal cancer.  Direct examination of the colon should be repeated every  5-10 years until age 71. However, if early forms of precancerous polyps or small growths are found or if you have a family history or genetic risk for colorectal cancer, you may need to be screened more often.  Skin Cancer  Check your skin from head to toe regularly.  Monitor any moles. Be sure to tell your health care provider: ? About any new moles or changes in moles, especially if there is a change in a mole's shape or color. ? If you have a mole that is larger than the size of a pencil eraser.  If any of your family members has a history of skin cancer, especially at a young age, talk with your health care provider about genetic screening.  Always use sunscreen. Apply sunscreen liberally and repeatedly throughout the day.  Whenever you are outside, protect yourself by wearing long sleeves, pants, a wide-brimmed hat, and sunglasses.  What should I know about osteoporosis? Osteoporosis is a condition in which bone destruction happens more quickly than new bone creation. After menopause, you may be at an increased risk for osteoporosis. To help prevent osteoporosis or the bone fractures that can happen because of osteoporosis, the following is recommended:  If you are 46-71 years old, get at least 1,000 mg of calcium and at least 600 mg of vitamin D per day.  If you are older than age 55 but younger than age 65, get at least 1,200 mg of calcium and at least 600 mg of vitamin D per day.  If you are older than age 54, get at least 1,200 mg of calcium and at least 800 mg of vitamin D per day.  Smoking and excessive alcohol intake increase the risk of osteoporosis. Eat foods that are rich in calcium and vitamin D, and do weight-bearing exercises several times each week as directed by your health care provider. What should I know about how menopause affects my mental health? Depression may occur at any age, but it is more common as you become older. Common symptoms of depression  include:  Low or sad mood.  Changes in sleep patterns.  Changes in appetite or eating patterns.  Feeling an overall lack of motivation or enjoyment of activities that you previously enjoyed.  Frequent crying spells.  Talk with your health care provider if you think that you are experiencing depression. What should I know about immunizations? It is important that you get and maintain your immunizations. These include:  Tetanus, diphtheria, and pertussis (Tdap) booster vaccine.  Influenza every year before the flu season begins.  Pneumonia vaccine.  Shingles vaccine.  Your health care provider may also recommend other immunizations. This information is not intended to replace advice given to you by your health care provider. Make sure you discuss any questions you have with your health care provider. Document Released: 10/20/2005  Document Revised: 03/17/2016 Document Reviewed: 06/01/2015 Elsevier Interactive Patient Education  2018 [place].      Atrophic Vaginitis Atrophic vaginitis is a condition in which the tissues that line the vagina become dry and thin. This condition is most common in women who have stopped having regular menstrual periods (menopause). This usually starts when a woman is 9-60 years old. Estrogen helps to keep the vagina moist. It stimulates the vagina to produce a clear fluid that lubricates the vagina for sexual intercourse. This fluid also protects the vagina from infection. Lack of estrogen can cause the lining of the vagina to get thinner and dryer. The vagina may also shrink in size. It may become less elastic. Atrophic vaginitis tends to get worse over time as a woman's estrogen level drops. What are the causes? This condition is caused by the normal drop in estrogen that happens around the time of menopause. What increases the risk? Certain conditions or situations may lower a woman's estrogen level, which increases her risk of atrophic  vaginitis. These include:  Taking medicine that blocks estrogen.  Having ovaries removed surgically.  Being treated for cancer with X-ray treatment (radiation) or medicines (chemotherapy).  Exercising very hard and often.  Having an eating disorder (anorexia).  Giving birth or breastfeeding.  Being over the age of 94.  Smoking.  What are the signs or symptoms? Symptoms of this condition include:  Pain, soreness, or bleeding during sexual intercourse (dyspareunia).  Vaginal burning, irritation, or itching.  Pain or bleeding during a vaginal examination using a speculum (pelvic exam).  Loss of interest in sexual activity.  Having burning pain when passing urine.  Vaginal discharge that is brown or yellow.  In some cases, there are no symptoms. How is this diagnosed? This condition is diagnosed with a medical history and physical exam. This will include a pelvic exam that checks whether the inside of your vagina appears pale, thin, or dry. Rarely, you may also have other tests, including:  A urine test.  A test that checks the acid balance in your vaginal fluid (acid balance test).  How is this treated? Treatment for this condition may depend on the severity of your symptoms. Treatment may include:  Using an over-the-counter vaginal lubricant before you have sexual intercourse.  Using a long-acting vaginal moisturizer.  Using low-dose vaginal estrogen for moderate to severe symptoms that do not respond to other treatments. Options include creams, tablets, and inserts (vaginal rings). Before using vaginal estrogen, tell your health care provider if you have a history of: ? Breast cancer. ? Endometrial cancer. ? Blood clots.  Taking medicines. You may be able to take a daily pill for dyspareunia. Discuss all of the risks of this medicine with your health care provider. It is usually not recommended for women who have a family history or personal history of breast  cancer.  If your symptoms are very mild and you are not sexually active, you may not need treatment. Follow these instructions at home:  Take medicines only as directed by your health care provider. Do not use herbal or alternative medicines unless your health care provider says that you can.  Use over-the-counter creams, lubricants, or moisturizers for dryness only as directed by your health care provider.  If your atrophic vaginitis is caused by menopause, discuss all of your menopausal symptoms and treatment options with your health care provider.  Do not douche.  Do not use products that can make your vagina dry. These include: ?  Scented feminine sprays. ? Scented tampons. ? Scented soaps.  If it hurts to have sex, talk with your sexual partner. Contact a health care provider if:  Your discharge looks different than normal.  Your vagina has an unusual smell.  You have new symptoms.  Your symptoms do not improve with treatment.  Your symptoms get worse. This information is not intended to replace advice given to you by your health care provider. Make sure you discuss any questions you have with your health care provider. Document Released: 01/12/2015 Document Revised: 02/03/2016 Document Reviewed: 08/19/2014 Elsevier Interactive Patient Education  [employer].

## 2018-02-21 NOTE — Progress Notes (Signed)
Pt is present today for annual exam. Pt stated having gus of water/fluids (not urine) come from her vaginal  she has wetted her underwear and had to change them 3 times. Pt stated having pain with intercourse. Burning and cramping like she is going to have a cycle. Pt LMP 06-22-15.

## 2018-02-21 NOTE — Progress Notes (Signed)
ANNUAL PREVENTATIVE CARE GYNECOLOGY  ENCOUNTER NOTE  Subjective:       Yesenia Flores is a 52 y.o. [phone] menopausal female here for a routine annual gynecologic exam. The patient is sexually active (but notes very infrequent). The patient is not taking hormone replacement therapy. Patient denies post-menopausal vaginal bleeding. The patient wears seatbelts: yes. The patient participates in regular exercise: no. Has the patient ever been transfused or tattooed?: no. The patient reports that there is not domestic violence in her life.  Current complaints: 1.  Patient notes that she has had episodes of gush of fluid coming from the vagina.  She does not think that it is urine.  She has also been noting painful sex, vaginal burning, and cramping that has felt like menstrual cramps. All symptoms started approximately 2 weeks ago.    Gynecologic History Patient's last menstrual period was 06/22/2015. Contraception: post menopausal status Last Pap: 2015. Results were: normal Last mammogram: 10/2017. Results were: normal Last Colonoscopy:  Last Dexa Scan:    Obstetric History OB History  Gravida Para Term Preterm AB Living  4 4 4     8   SAB TAB Ectopic Multiple Live Births          4    # Outcome Date GA Lbr Len/2nd Weight Sex Delivery Anes PTL Lv  4 Term      CS-LTranv   LIV  3 Term      CS-LTranv   LIV  2 Term      Vag-Spont   LIV  1 Term      Vag-Spont   LIV    Obstetric Comments  Age at first menstruation-9  Age at first pregnancy-15  tubal    Past Medical History:  Diagnosis Date  . Anemia   . Bladder infection 2013  . Breast screening, unspecified 2013  . H/O CT scan of chest 2012  . 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 Other malaise and fatigue 2013  . Other sign and symptom in breast 2013  . Rash and other nonspecific skin eruption 2013    Family History  Problem Relation Age of Onset  . Thyroid disease Mother   . Breast cancer  Maternal Aunt     Past Surgical History:  Procedure Laterality Date  . BREAST CYST ASPIRATION Right 2014   NEG  . CESAREAN SECTION  [id number]  . TUBAL LIGATION Bilateral 2013    Social History   Socioeconomic History  . Marital status: Married    Spouse name: Not on file  . Number of children: Not on file  . Years of education: Not on file  . Highest education level: Not on file  Occupational History  . Not on file  Social Needs  . Financial resource strain: Not on file  . Food insecurity:    Worry: Not on file    Inability: Not on file  . Transportation needs:    Medical: Not on file    Non-medical: Not on file  Tobacco Use  . Smoking status: Light Tobacco Smoker  . Smokeless tobacco: Never Used  Substance and Sexual Activity  . Alcohol use: No    Frequency: Never  . Drug use: No  . Sexual activity: Yes    Birth control/protection: Post-menopausal  Lifestyle  . Physical activity:    Days per week: Not on file    Minutes per session: Not on file  . Stress: Not on file  Relationships  .  Social connections:    Talks on phone: Not on file    Gets together: Not on file    Attends religious service: Not on file    Active member of club or organization: Not on file    Attends meetings of clubs or organizations: Not on file    Relationship status: Not on file  . Intimate partner violence:    Fear of current or ex partner: Not on file    Emotionally abused: Not on file    Physically abused: Not on file    Forced sexual activity: Not on file  Other Topics Concern  . Not on file  Social History Narrative  . Not on file    No current outpatient medications on file prior to visit.   No current facility-administered medications on file prior to visit.     No Known Allergies    Review of Systems ROS   Review of Systems - General ROS: negative for - chills, fatigue, fever, hot flashes, night sweats, weight gain or weight loss Psychological ROS: negative for  - anxiety, decreased libido, depression, mood swings, physical abuse or sexual abuse Ophthalmic ROS: negative for - blurry vision, eye pain or loss of vision ENT ROS: negative for - headaches, hearing change, visual changes or vocal changes Allergy and Immunology ROS: negative for - hives, itchy/watery eyes or seasonal allergies Hematological and Lymphatic ROS: negative for - bleeding problems, bruising, swollen lymph nodes or weight loss Endocrine ROS: negative for - galactorrhea, hair pattern changes, hot flashes, malaise/lethargy, mood swings, palpitations, polydipsia/polyuria, skin changes, temperature intolerance or unexpected weight changes Breast ROS: negative for - new or changing breast lumps or nipple discharge Respiratory ROS: negative for - cough or shortness of breath Cardiovascular ROS: negative for - chest pain, irregular heartbeat, palpitations or shortness of breath Gastrointestinal ROS: no abdominal pain, change in bowel habits, or black or bloody stools Genito-Urinary ROS: no dysuria, trouble voiding, or hematuria Musculoskeletal ROS: negative for - joint pain or joint stiffness Neurological ROS: negative for - bowel and bladder control changes Dermatological ROS: negative for rash and skin lesion changes   Objective:   BP 110/68   Pulse 83   Ht 5\' 5"  (1.651 m)   Wt 172 lb 14.4 oz (78.4 kg)   LMP 06/22/2015   BMI 28.77 kg/m  CONSTITUTIONAL: Well-developed, well-nourished female in no acute distress. Overweight PSYCHIATRIC: Normal mood and affect. Normal behavior. Normal judgment and thought content. [place]: Alert and oriented to person, place, and time. Normal muscle tone coordination. No cranial nerve deficit noted. HENT:  Normocephalic, atraumatic, External right and left ear normal. Oropharynx is clear and moist EYES: Conjunctivae and EOM are normal. Pupils are equal, round, and reactive to light. No scleral icterus.  NECK: Normal range of motion, supple, no  masses.  Normal thyroid.  SKIN: Skin is warm and dry. No rash noted. Not diaphoretic. No erythema. No pallor. CARDIOVASCULAR: Normal heart rate noted, regular rhythm, no murmur. RESPIRATORY: Clear to auscultation bilaterally. Effort and breath sounds normal, no problems with respiration noted. BREASTS: Symmetric in size. No masses, skin changes, nipple drainage, or lymphadenopathy. ABDOMEN: Soft, normal bowel sounds, no distention noted.  No tenderness, rebound or guarding.  BLADDER: Normal PELVIC:  Bladder no bladder distension noted  Urethra: normal appearing urethra with no masses, tenderness or lesions  Vulva: normal appearing vulva with no masses, tenderness or lesions  Vagina: atrophic,  also mildly erythematous.  No lesions, discharge, or tenderness.   Cervix: erythematous.  No lesions  Uterus: uterus is normal size, shape, consistency and nontender  Adnexa: normal adnexa in size, nontender and no masses  RV: External Exam NormaI, No Rectal Masses and Normal Sphincter tone  MUSCULOSKELETAL: Normal range of motion. No tenderness.  No cyanosis, clubbing, or edema.  2+ distal pulses. LYMPHATIC: No Axillary, Supraclavicular, or Inguinal Adenopathy.   Microscopic wet-mount exam shows negative for pathogens, normal epithelial cells.  Labs: Lab Results  Component Value Date   WBC 6.7 02/18/2016   HGB 10.2 (L) 02/18/2016   HCT 32.9 (L) 02/18/2016   MCV 71.9 (L) 02/18/2016   PLT 258 02/18/2016    Lab Results  Component Value Date   CREATININE 0.79 02/18/2016   BUN 13 02/18/2016   NA 140 02/18/2016   K 4.0 02/18/2016   CL 108 02/18/2016   CO2 25 02/18/2016    Lab Results  Component Value Date   ALT 11 (L) 02/18/2016   AST 25 02/18/2016   ALKPHOS 67 02/18/2016   BILITOT 0.4 02/18/2016    Lab Results  Component Value Date   CHOL 183 01/12/2016   HDL 56 01/12/2016   LDLCALC 101 (H) 01/12/2016   TRIG 132 01/12/2016   CHOLHDL 3.3 01/12/2016    Lab Results  Component  Value Date   TSH 1.670 01/12/2016    Lab Results  Component Value Date   HGBA1C 5.6 01/12/2016     Assessment:   Annual gynecologic examination 52 y.o. Vaginal discharge Vaginal atrophy Dyspareunia Pelvic cramping Overweight   Plan:  - Pap: Pap Co Test and GC/CT NAAT - Mammogram: up to date - Stool Guaiac Testing:  Not Ordered.  Will order screening colonoscopy.  - Labs: Lipid 1, FBS and TSH, CBC, CMP.  - Routine preventative health maintenance measures emphasized: Exercise/Diet/Weight control, Tobacco Warnings, Alcohol/Substance use risks and Stress Management.  - Patient with atrophic vaginitis as likely cause of discharge.  Discussed management options, including OTC treatments (coconut oil, mineral oil), local hormonal therapy (estrogen cream, tablet, vaginal ring; or testosterone tablets), oral therapy with Osphena.  Patient would like to try Osphena.  Given samples. To f/u in 4-6 weeks.  -    Return to [place], MD [facility]

## 2018-02-22 DIAGNOSIS — Z124 Encounter for screening for malignant neoplasm of cervix: Secondary | ICD-10-CM | POA: Diagnosis not present

## 2018-02-22 LAB — VITAMIN D 25 HYDROXY (VIT D DEFICIENCY, FRACTURES): Vit D, 25-Hydroxy: 16.4 ng/mL — ABNORMAL LOW (ref 30.0–100.0)

## 2018-02-22 LAB — COMPREHENSIVE METABOLIC PANEL
A/G RATIO: 1.6 (ref 1.2–2.2)
ALBUMIN: 4.4 g/dL (ref 3.5–5.5)
ALT: 14 IU/L (ref 0–32)
AST: 17 IU/L (ref 0–40)
Alkaline Phosphatase: 92 IU/L (ref 39–117)
BUN / CREAT RATIO: 14 (ref 9–23)
BUN: 13 mg/dL (ref 6–24)
Bilirubin Total: 0.2 mg/dL (ref 0.0–1.2)
CALCIUM: 9.7 mg/dL (ref 8.7–10.2)
CHLORIDE: 106 mmol/L (ref 96–106)
CO2: 23 mmol/L (ref 20–29)
Creatinine, Ser: 0.93 mg/dL (ref 0.57–1.00)
GFR, EST AFRICAN AMERICAN: 81 mL/min/{1.73_m2} (ref >59)
GFR, EST NON AFRICAN AMERICAN: 70 mL/min/{1.73_m2} (ref >59)
GLOBULIN, TOTAL: 2.7 g/dL (ref 1.5–4.5)
Glucose: 96 mg/dL (ref 65–99)
POTASSIUM: 4.4 mmol/L (ref 3.5–5.2)
SODIUM: 142 mmol/L (ref 134–144)
TOTAL PROTEIN: 7.1 g/dL (ref 6.0–8.5)

## 2018-02-22 LAB — LIPID PANEL
CHOLESTEROL TOTAL: 204 mg/dL — AB (ref 100–199)
Chol/HDL Ratio: 3.8 ratio (ref 0.0–4.4)
HDL: 53 mg/dL (ref >39)
LDL CALC: 120 mg/dL — AB (ref 0–99)
TRIGLYCERIDES: 155 mg/dL — AB (ref 0–149)
VLDL Cholesterol Cal: 31 mg/dL (ref 5–40)

## 2018-02-22 LAB — CBC
HEMATOCRIT: 31.4 % — AB (ref 34.0–46.6)
Hemoglobin: 9.3 g/dL — ABNORMAL LOW (ref 11.1–15.9)
MCH: 22.1 pg — ABNORMAL LOW (ref 26.6–33.0)
MCHC: 29.6 g/dL — ABNORMAL LOW (ref 31.5–35.7)
MCV: 75 fL — AB (ref 79–97)
Platelets: 248 10*3/uL (ref 150–450)
RBC: 4.2 x10E6/uL (ref 3.77–5.28)
RDW: 17.3 % — ABNORMAL HIGH (ref 12.3–15.4)
WBC: 6.3 10*3/uL (ref 3.4–10.8)

## 2018-02-22 LAB — TSH: TSH: 1.73 u[IU]/mL (ref 0.450–4.500)

## 2018-02-25 ENCOUNTER — Telehealth: Payer: Self-pay | Admitting: Obstetrics and Gynecology

## 2018-02-25 ENCOUNTER — Other Ambulatory Visit: Payer: Self-pay

## 2018-02-25 NOTE — Telephone Encounter (Signed)
Pt called no answer LM via voicemail to call the office.  

## 2018-02-25 NOTE — Telephone Encounter (Signed)
Pt called back no answer LM via voicemail to call the office back.

## 2018-02-25 NOTE — Telephone Encounter (Signed)
The patient called and stated that she would like to speak with Dr. Marcelline Mates or her nurse in regards to her needing to discuss her test results, Please advise.

## 2018-02-25 NOTE — Telephone Encounter (Signed)
Pt called and went over test results with her. Pt stated that she didn't feel well and wanted to know if [facility] could call in some medication for her sore throat and sinus infection. Pt was informed that a message would be sent to [facility] for advise.

## 2018-02-26 NOTE — Telephone Encounter (Signed)
Pt called back to the office and was informed that [facility] advised that she try some OTC medication for 7 days before she would prescribe antibiotics.

## 2018-02-26 NOTE — Telephone Encounter (Signed)
Pt stated that she understood and stated that she has tried everything OTC and nothing was working. Pt was advised to try Apple Cider and honey for her sore throat.

## 2018-02-26 NOTE — Telephone Encounter (Signed)
Pt called no answer LM via voicemail to inform her that [facility] wanted her to try something OTC before she would prescribe an antibiotic.

## 2018-02-28 LAB — PAP IG, CT-NG NAA, HPV HIGH-RISK
Chlamydia, Nuc. Acid Amp: NEGATIVE
Gonococcus by Nucleic Acid Amp: NEGATIVE
HPV, HIGH-RISK: NEGATIVE
PAP SMEAR COMMENT: 0

## 2018-04-03 ENCOUNTER — Encounter: Payer: 59 | Admitting: Obstetrics and Gynecology

## 2018-05-30 ENCOUNTER — Other Ambulatory Visit: Payer: Self-pay | Admitting: Family Medicine

## 2018-05-30 DIAGNOSIS — R928 Other abnormal and inconclusive findings on diagnostic imaging of breast: Secondary | ICD-10-CM

## 2018-06-20 ENCOUNTER — Ambulatory Visit
Admission: RE | Admit: 2018-06-20 | Discharge: 2018-06-20 | Disposition: A | Discharge status: Home / Self Care | Payer: 59 | Source: Ambulatory Visit | Attending: Family Medicine | Admitting: Family Medicine

## 2018-06-20 ENCOUNTER — Other Ambulatory Visit: Payer: Self-pay | Admitting: Family Medicine

## 2018-06-20 DIAGNOSIS — R928 Other abnormal and inconclusive findings on diagnostic imaging of breast: Secondary | ICD-10-CM | POA: Diagnosis not present

## 2018-06-20 DIAGNOSIS — N6011 Diffuse cystic mastopathy of right breast: Secondary | ICD-10-CM | POA: Diagnosis not present

## 2018-07-01 ENCOUNTER — Other Ambulatory Visit: Payer: Self-pay | Admitting: Family Medicine

## 2018-07-01 ENCOUNTER — Other Ambulatory Visit: Payer: Self-pay | Admitting: Urgent Care

## 2018-07-01 DIAGNOSIS — R928 Other abnormal and inconclusive findings on diagnostic imaging of breast: Secondary | ICD-10-CM

## 2018-07-04 ENCOUNTER — Ambulatory Visit
Admission: RE | Admit: 2018-07-04 | Discharge: 2018-07-04 | Disposition: A | Discharge status: Home / Self Care | Payer: 59 | Source: Ambulatory Visit | Attending: Family Medicine | Admitting: Family Medicine

## 2018-07-04 DIAGNOSIS — R928 Other abnormal and inconclusive findings on diagnostic imaging of breast: Secondary | ICD-10-CM | POA: Insufficient documentation

## 2018-07-04 HISTORY — PX: BREAST BIOPSY: SHX20

## 2018-07-05 LAB — SURGICAL PATHOLOGY

## 2018-07-11 ENCOUNTER — Other Ambulatory Visit: Payer: Self-pay | Admitting: Urgent Care

## 2019-02-25 ENCOUNTER — Other Ambulatory Visit: Payer: Self-pay | Admitting: Physician Assistant

## 2019-02-25 DIAGNOSIS — R928 Other abnormal and inconclusive findings on diagnostic imaging of breast: Secondary | ICD-10-CM

## 2019-03-04 ENCOUNTER — Other Ambulatory Visit: Payer: Self-pay

## 2019-03-04 ENCOUNTER — Ambulatory Visit
Admission: RE | Admit: 2019-03-04 | Discharge: 2019-03-04 | Disposition: A | Discharge status: Home / Self Care | Payer: 59 | Source: Ambulatory Visit | Attending: Physician Assistant | Admitting: Physician Assistant

## 2019-03-04 DIAGNOSIS — R928 Other abnormal and inconclusive findings on diagnostic imaging of breast: Secondary | ICD-10-CM | POA: Diagnosis not present

## 2019-03-05 ENCOUNTER — Encounter: Payer: 59 | Admitting: Obstetrics and Gynecology

## 2020-09-10 ENCOUNTER — Ambulatory Visit
Admission: RE | Admit: 2020-09-10 | Discharge: 2020-09-10 | Disposition: A | Discharge status: Home / Self Care | Payer: BC Managed Care – PPO | Source: Ambulatory Visit | Attending: Family Medicine | Admitting: Family Medicine

## 2020-09-10 ENCOUNTER — Other Ambulatory Visit: Payer: Self-pay

## 2020-09-10 VITALS — BP 149/96 | HR 81 | Temp 98.9°F | Resp 18 | Ht 64.0 in | Wt 170.0 lb

## 2020-09-10 DIAGNOSIS — Z720 Tobacco use: Secondary | ICD-10-CM | POA: Diagnosis not present

## 2020-09-10 DIAGNOSIS — J0100 Acute maxillary sinusitis, unspecified: Secondary | ICD-10-CM | POA: Diagnosis not present

## 2020-09-10 DIAGNOSIS — R059 Cough, unspecified: Secondary | ICD-10-CM | POA: Diagnosis not present

## 2020-09-10 DIAGNOSIS — Z20822 Contact with and (suspected) exposure to covid-19: Secondary | ICD-10-CM | POA: Diagnosis not present

## 2020-09-10 DIAGNOSIS — J3489 Other specified disorders of nose and nasal sinuses: Secondary | ICD-10-CM | POA: Diagnosis present

## 2020-09-10 LAB — RESP PANEL BY RT-PCR (FLU A&B, COVID) ARPGX2
Influenza A by PCR: NEGATIVE
Influenza B by PCR: NEGATIVE
SARS Coronavirus 2 by RT PCR: NEGATIVE

## 2020-09-10 MED ORDER — AMOXICILLIN-POT CLAVULANATE 875-125 MG PO TABS: 1.0000 | ORAL_TABLET | Freq: Two times a day (BID) | ORAL | 0 refills | Status: DC

## 2020-09-10 NOTE — ED Provider Notes (Signed)
[facility]    CSN: [id number] Arrival date & time: 09/10/20  3614      History   Chief Complaint Chief Complaint  Patient presents with  . Appointment  . Nasal Congestion   HPI  54 year old female presents with respiratory symptoms.  Patient has been sick for the past week.  Reports fever, T-max 101.  Reports ear pain, sinus pain pressure, dizziness, cough.  She is concerned that she has sinusitis.  Patient states that she needs Covid testing today as she needs to visit her father.  No relieving factors.  Pain currently 2/10 in severity.  No other reported symptoms.  No other complaints.  Past Medical History:  Diagnosis Date  . Anemia   . Bladder infection 2013  . Breast screening, unspecified 2013  . H/O CT scan of chest 2012  . 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 Other malaise and fatigue 2013  . Other sign and symptom in breast 2013  . Rash and other nonspecific skin eruption 2013    Patient Active Problem List   Diagnosis Date Noted  . Abnormal mammogram of left breast 01/17/2016  . Dyspareunia in female 01/17/2016  . 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 H/O CT scan of chest     Past Surgical History:  Procedure Laterality Date  . BREAST BIOPSY Right 07/04/2018   Rt. 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 BREAST CYST ASPIRATION Right 2014   NEG  . CESAREAN SECTION  [id number]  . TUBAL LIGATION Bilateral 2013    OB History    Gravida  4   Para  4   Term  4   Preterm      AB      Living  8     SAB      IAB      Ectopic      Multiple      Live Births  4        Obstetric Comments  Age at first menstruation-9 Age at first pregnancy-15 tubal         Home Medications    Prior to Admission medications   Medication Sig Start Date End Date Taking? Authorizing Provider  amoxicillin-clavulanate (AUGMENTIN) 875-125 MG tablet  Take 1 tablet by mouth 2 (two) times daily. 09/10/20  Yes Tommie Sams, DO    Family History Family History  Problem Relation Age of Onset  . Thyroid disease Mother   . Breast cancer Maternal Aunt     Social History Social History   Tobacco Use  . Smoking status: Light Tobacco Smoker  . Smokeless tobacco: Never Used  Vaping Use  . Vaping Use: Never used  Substance Use Topics  . Alcohol use: No  . Drug use: No     Allergies   Patient has no known allergies.   Review of Systems Review of Systems Per HPI  Physical Exam Triage Vital Signs ED Triage Vitals  Enc Vitals Group     BP 09/10/20 1020 (!) 149/96     Pulse Rate 09/10/20 1020 81     Resp 09/10/20 1020 18     Temp 09/10/20 1020 98.9 F (37.2 C)     Temp Source 09/10/20 1020 Oral     SpO2 09/10/20 1020 99 %     Weight 09/10/20 1019 170 lb (77.1 kg)     Height 09/10/20 1019 5'  4" (1.626 m)     Head Circumference --      Peak Flow --      Pain Score 09/10/20 1019 2     Pain Loc --      Pain Edu? --      Excl. in [place]? --    Updated Vital Signs BP (!) 149/96 (BP Location: Left Arm)   Pulse 81   Temp 98.9 F (37.2 C) (Oral)   Resp 18   Ht 5\' 4"  (1.626 m)   Wt 77.1 kg   LMP 06/22/2015   SpO2 99%   BMI 29.18 kg/m   Visual Acuity Right Eye Distance:   Left Eye Distance:   Bilateral Distance:    Right Eye Near:   Left Eye Near:    Bilateral Near:     Physical Exam Vitals and nursing note reviewed.  Constitutional:      General: She is not in acute distress.    Appearance: Normal appearance. She is not ill-appearing.  HENT:     Head: Normocephalic and atraumatic.     Right Ear: Tympanic membrane normal.     Left Ear: Tympanic membrane normal.     Nose:     Comments: Maxillary sinus tenderness to palpation.    Mouth/Throat:     Pharynx: Oropharynx is clear. No oropharyngeal exudate or posterior oropharyngeal erythema.  Cardiovascular:     Rate and Rhythm: Normal rate and regular rhythm.      Heart sounds: No murmur heard.   Pulmonary:     Effort: Pulmonary effort is normal.     Breath sounds: Normal breath sounds. No wheezing, rhonchi or rales.  Neurological:     Mental Status: She is alert.    UC Treatments / Results  Labs (all labs ordered are listed, but only abnormal results are displayed) Labs Reviewed  RESP PANEL BY RT-PCR (FLU A&B, COVID) [id number]    EKG   Radiology No results found.  Procedures Procedures (including critical care time)  Medications Ordered in UC Medications - No data to display  Initial Impression / Assessment and Plan / UC Course  I have reviewed the triage vital signs and the nursing notes.  Pertinent labs & imaging results that were available during my care of the patient were reviewed by me and considered in my medical decision making (see chart for details).    54 year old female presents with sinusitis.  Covid and flu testing negative.  Patient is experiencing systemic symptoms that she has had ongoing fever.  Treating with Augmentin.  Final Clinical Impressions(s) / UC Diagnoses   Final diagnoses:  Acute maxillary sinusitis, recurrence not specified   Discharge Instructions   None    ED Prescriptions    Medication Sig Dispense Auth. Provider   amoxicillin-clavulanate (AUGMENTIN) 875-125 MG tablet Take 1 tablet by mouth 2 (two) times daily. 20 tablet Coral Spikes, DO     PDMP not reviewed this encounter.   Coral Spikes, DO 09/10/20 1256

## 2020-09-10 NOTE — ED Triage Notes (Signed)
Patient states that she has nasal congestion, fever and body aches x 7 days. States that she needs to have covid testing in order to see her father who is passing.

## 2020-09-22 ENCOUNTER — Ambulatory Visit
Admission: RE | Admit: 2020-09-22 | Discharge: 2020-09-22 | Disposition: A | Discharge status: Home / Self Care | Payer: BC Managed Care – PPO | Source: Ambulatory Visit | Attending: Family Medicine | Admitting: Family Medicine

## 2020-09-22 ENCOUNTER — Other Ambulatory Visit: Payer: Self-pay

## 2020-09-22 VITALS — BP 137/93 | HR 93 | Temp 99.4°F | Resp 18 | Ht 64.0 in | Wt 165.0 lb

## 2020-09-22 DIAGNOSIS — U071 COVID-19: Secondary | ICD-10-CM | POA: Diagnosis not present

## 2020-09-22 DIAGNOSIS — J111 Influenza due to unidentified influenza virus with other respiratory manifestations: Secondary | ICD-10-CM

## 2020-09-22 DIAGNOSIS — Z20822 Contact with and (suspected) exposure to covid-19: Secondary | ICD-10-CM | POA: Diagnosis present

## 2020-09-22 LAB — SARS CORONAVIRUS 2 (TAT 6-24 HRS): SARS Coronavirus 2: POSITIVE — AB

## 2020-09-22 LAB — RAPID INFLUENZA A&B ANTIGENS
Influenza A (ARMC): NEGATIVE
Influenza B (ARMC): NEGATIVE

## 2020-09-22 MED ORDER — KETOROLAC TROMETHAMINE 10 MG PO TABS: 10.0000 mg | ORAL_TABLET | Freq: Four times a day (QID) | ORAL | 0 refills | Status: DC | PRN

## 2020-09-22 NOTE — ED Provider Notes (Signed)
[facility]    CSN: [id number] Arrival date & time: 09/22/20  1151      History   Chief Complaint Chief Complaint  Patient presents with  . Appointment  . Generalized Body Aches  . Cough  . Fatigue   HPI   55 year old female presents with the above complaints.  Patient recently seen by me and was treated for sinusitis.  She states that she improved and was doing well until Monday night.  Patient reports that she has had fever as high as 103.  She has had cough, body aches, headache, and fatigue.  Recently attended a funeral and her sister has recently tested positive for COVID.  She feels very poorly.  No relieving factors.  Work is requiring her to have a COVID test.  Pain 8/10 in severity.  No other reported symptoms.  No other complaints.  Past Medical History:  Diagnosis Date  . Anemia   . Bladder infection 2013  . Breast screening, unspecified 2013  . H/O CT scan of chest 2012  . 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 Other malaise and fatigue 2013  . Other sign and symptom in breast 2013  . Rash and other nonspecific skin eruption 2013    Patient Active Problem List   Diagnosis Date Noted  . Abnormal mammogram of left breast 01/17/2016  . Dyspareunia in female 01/17/2016  . 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 H/O CT scan of chest     Past Surgical History:  Procedure Laterality Date  . BREAST BIOPSY Right 07/04/2018   Rt. 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 BREAST CYST ASPIRATION Right 2014   NEG  . CESAREAN SECTION  [id number]  . TUBAL LIGATION Bilateral 2013    OB History    Gravida  4   Para  4   Term  4   Preterm      AB      Living  8     SAB      IAB      Ectopic      Multiple      Live Births  4        Obstetric Comments  Age at first menstruation-75 Age at first pregnancy-15 tubal         Home Medications     Prior to Admission medications   Medication Sig Start Date End Date Taking? Authorizing Provider  amoxicillin-clavulanate (AUGMENTIN) 875-125 MG tablet Take 1 tablet by mouth 2 (two) times daily. 09/10/20  Yes Marqual Mi G, DO  ketorolac (TORADOL) 10 MG tablet Take 1 tablet (10 mg total) by mouth every 6 (six) hours as needed for moderate pain or severe pain. 09/22/20  Yes Coral Spikes, DO    Family History Family History  Problem Relation Age of Onset  . Thyroid disease Mother   . Other Father        unknown medical history  . Breast cancer Maternal Aunt     Social History Social History   Tobacco Use  . Smoking status: Never Smoker  . Smokeless tobacco: Never Used  Vaping Use  . Vaping Use: Never used  Substance Use Topics  . Alcohol use: No  . Drug use: No     Allergies   No known allergies   Review of Systems Review of Systems Per HPI  Physical Exam Triage Vital Signs ED  Triage Vitals  Enc Vitals Group     BP 09/22/20 1236 (!) 137/93     Pulse Rate 09/22/20 1236 93     Resp 09/22/20 1236 18     Temp 09/22/20 1236 99.4 F (37.4 C)     Temp Source 09/22/20 1236 Oral     SpO2 09/22/20 1236 99 %     Weight 09/22/20 1235 165 lb (74.8 kg)     Height 09/22/20 1235 5\' 4"  (1.626 m)     Head Circumference --      Peak Flow --      Pain Score 09/22/20 1235 8     Pain Loc --      Pain Edu? --      Excl. in [place]? --    Updated Vital Signs BP (!) 137/93 (BP Location: Left Arm)   Pulse 93   Temp 99.4 F (37.4 C) (Oral)   Resp 18   Ht 5\' 4"  (1.626 m)   Wt 74.8 kg   LMP 06/22/2015   SpO2 99%   BMI 28.32 kg/m   Visual Acuity Right Eye Distance:   Left Eye Distance:   Bilateral Distance:    Right Eye Near:   Left Eye Near:    Bilateral Near:     Physical Exam Vitals and nursing note reviewed.  Constitutional:      General: She is not in acute distress.    Appearance: She is ill-appearing.  HENT:     Head: Normocephalic and atraumatic.  Eyes:      General:        Right eye: No discharge.        Left eye: No discharge.     Conjunctiva/sclera: Conjunctivae normal.  Cardiovascular:     Rate and Rhythm: Normal rate and regular rhythm.     Heart sounds: No murmur heard.   Pulmonary:     Effort: Pulmonary effort is normal.     Breath sounds: Normal breath sounds. No wheezing or rales.  Neurological:     Mental Status: She is alert.  Psychiatric:        Mood and Affect: Mood normal.        Behavior: Behavior normal.    UC Treatments / Results  Labs (all labs ordered are listed, but only abnormal results are displayed) Labs Reviewed  SARS CORONAVIRUS 2 (TAT 6-24 HRS)  RAPID INFLUENZA A&B ANTIGENS    EKG   Radiology No results found.  Procedures Procedures (including critical care time)  Medications Ordered in UC Medications - No data to display  Initial Impression / Assessment and Plan / UC Course  I have reviewed the triage vital signs and the nursing notes.  Pertinent labs & imaging results that were available during my care of the patient were reviewed by me and considered in my medical decision making (see chart for details).    54 year old female presents with influenza-like illness.  Suspected COVID-19.  Flu testing negative today.  Awaiting COVID test results.  Toradol for body aches.  Work note given.  Supportive care.   Final Clinical Impressions(s) / UC Diagnoses   Final diagnoses:  Influenza-like illness  Suspected COVID-19 virus infection     Discharge Instructions     Medication as prescribed.  Stay home.  Check my chart for COVID test results.  Take care  Dr. Lacinda Axon      ED Prescriptions    Medication Sig Dispense Auth. Provider   ketorolac (TORADOL) 10 MG tablet  Take 1 tablet (10 mg total) by mouth every 6 (six) hours as needed for moderate pain or severe pain. 20 tablet Coral Spikes, DO     PDMP not reviewed this encounter.   Coral Spikes, [place] 09/22/20 1403

## 2020-09-22 NOTE — Discharge Instructions (Signed)
Medication as prescribed.  Stay home.  Check my chart for COVID test results.  Take care  Dr. Lashawnda Hancox   

## 2020-09-22 NOTE — ED Triage Notes (Addendum)
Patient in today with cough, body aches, fatigue and fever (101.9-103.1) x 2 days. Patient's last dose of ibuprofen was ~8:30-9am this morning. Patient seen for sinus infection at [facility] on 09/10/20 and treated with antibiotics. Covid and flu tests were negative on 09/10/20. Patient states she got better, but then symptoms returned 2 days ago. Patient has not had the covid vaccines. Patient's work is requiring a covid test.

## 2020-09-30 ENCOUNTER — Other Ambulatory Visit: Payer: Self-pay

## 2020-09-30 ENCOUNTER — Ambulatory Visit (INDEPENDENT_AMBULATORY_CARE_PROVIDER_SITE_OTHER): Payer: BC Managed Care – PPO

## 2020-09-30 ENCOUNTER — Ambulatory Visit
Admission: EM | Admit: 2020-09-30 | Discharge: 2020-09-30 | Disposition: A | Discharge status: Home / Self Care | Payer: BC Managed Care – PPO | Attending: Family Medicine | Admitting: Family Medicine

## 2020-09-30 DIAGNOSIS — U071 COVID-19: Secondary | ICD-10-CM | POA: Insufficient documentation

## 2020-09-30 DIAGNOSIS — R0789 Other chest pain: Secondary | ICD-10-CM | POA: Diagnosis not present

## 2020-09-30 MED ORDER — BENZONATATE 100 MG PO CAPS: 100.0000 mg | ORAL_CAPSULE | Freq: Three times a day (TID) | ORAL | 0 refills | Status: AC

## 2020-09-30 MED ORDER — PREDNISONE 10 MG PO TABS: 40.0000 mg | ORAL_TABLET | Freq: Every day | ORAL | 0 refills | Status: AC

## 2020-09-30 NOTE — Discharge Instructions (Signed)
Your EKG and x-ray were normal.  I believe this is just continued chest tightness, lingering inflammation in the lungs from COVID. We will try to do prednisone daily for the next 5 days to see if this helps.  Tessalon Perles for cough as needed. Work note given

## 2020-09-30 NOTE — ED Provider Notes (Signed)
[name]    CSN: [id number] Arrival date & time: 09/30/20  1001      History   Chief Complaint Chief Complaint  Patient presents with  . Cough  . Shortness of Breath  . Chest Pain    HPI Yesenia Flores is a 55 y.o. female.   Pt is a 55 year old female that presents today with complaints of continued cough, midsternal chest pain with radiation pain to the left arm.  Describes as aching.  The pain is reproducible.  Was positive for COVID last Wednesday and URI symptoms began last Tuesday.  Went to work yesterday and had a hard time with exerting herself.  Felt very fatigued, lightheaded.  Patient trouble taking a deep breath and moving.  Denies any nausea, vomiting, radiation of pain to jaw, arm or back.  Took Toradol tab last night for chest pain without improvement.     Past Medical History:  Diagnosis Date  . Anemia   . Bladder infection 2013  . Breast screening, unspecified 2013  . H/O CT scan of chest 2012  . 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 Other malaise and fatigue 2013  . Other sign and symptom in breast 2013  . Rash and other nonspecific skin eruption 2013    Patient Active Problem List   Diagnosis Date Noted  . Abnormal mammogram of left breast 01/17/2016  . Dyspareunia in female 01/17/2016  . 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 H/O CT scan of chest     Past Surgical History:  Procedure Laterality Date  . BREAST BIOPSY Right 07/04/2018   Rt. 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 BREAST CYST ASPIRATION Right 2014   NEG  . CESAREAN SECTION  [id number]  . TUBAL LIGATION Bilateral 2013    OB History    Gravida  4   Para  4   Term  4   Preterm      AB      Living  8     SAB      IAB      Ectopic      Multiple      Live Births  4        Obstetric Comments  Age at first menstruation-62 Age at first pregnancy-15 tubal          Home Medications    Prior to Admission medications   Medication Sig Start Date End Date Taking? Authorizing Provider  benzonatate (TESSALON) 100 MG capsule Take 1 capsule (100 mg total) by mouth every 8 (eight) hours. 09/30/20  Yes Zhane Bluitt A, NP  predniSONE (DELTASONE) 10 MG tablet Take 4 tablets (40 mg total) by mouth daily for 5 days. 09/30/20 10/05/20 Yes Orvan July, NP    Family History Family History  Problem Relation Age of Onset  . Thyroid disease Mother   . Other Father        unknown medical history  . Breast cancer Maternal Aunt     Social History Social History   Tobacco Use  . Smoking status: Never Smoker  . Smokeless tobacco: Never Used  Vaping Use  . Vaping Use: Never used  Substance Use Topics  . Alcohol use: No  . Drug use: No     Allergies   No known allergies   Review of Systems Review of Systems   Physical Exam Triage Vital Signs  ED Triage Vitals  Enc Vitals Group     BP 09/30/20 1015 (!) 144/77     Pulse Rate 09/30/20 1015 75     Resp 09/30/20 1015 20     Temp 09/30/20 1015 98.5 F (36.9 C)     Temp Source 09/30/20 1015 Oral     SpO2 09/30/20 1015 98 %     Weight --      Height --      Head Circumference --      Peak Flow --      Pain Score 09/30/20 1024 5     Pain Loc --      Pain Edu? --      Excl. in [place]? --    No data found.  Updated Vital Signs BP (!) 144/77 (BP Location: Left Arm)   Pulse 75   Temp 98.5 F (36.9 C) (Oral)   Resp 20   LMP 06/22/2015   SpO2 98%   Visual Acuity Right Eye Distance:   Left Eye Distance:   Bilateral Distance:    Right Eye Near:   Left Eye Near:    Bilateral Near:     Physical Exam Vitals and nursing note reviewed.  Constitutional:      General: She is not in acute distress.    Appearance: Normal appearance. She is not ill-appearing, toxic-appearing or diaphoretic.  HENT:     Head: Normocephalic.     Nose: Nose normal.     Mouth/Throat:     Pharynx: Oropharynx is  clear.  Eyes:     Conjunctiva/sclera: Conjunctivae normal.  Cardiovascular:     Rate and Rhythm: Normal rate and regular rhythm.  Pulmonary:     Effort: Pulmonary effort is normal.     Breath sounds: Normal breath sounds.  Musculoskeletal:        General: Normal range of motion.     Cervical back: Normal range of motion.  Skin:    General: Skin is warm and dry.     Findings: No rash.  Neurological:     Mental Status: She is alert.  Psychiatric:        Mood and Affect: Mood normal.      UC Treatments / Results  Labs (all labs ordered are listed, but only abnormal results are displayed) Labs Reviewed - No data to display  EKG   Radiology DG Chest 2 View  Result Date: 09/30/2020 CLINICAL DATA:  Cough. Chest pain. Shortness of breath. COVID positive last Wednesday. EXAM: CHEST - 2 VIEW COMPARISON:  05/27/2015 FINDINGS: Midline trachea. Normal heart size and mediastinal contours. No pleural effusion or pneumothorax. Mild biapical pleuroparenchymal scarring. Lungs otherwise clear. IMPRESSION: No acute cardiopulmonary disease. Electronically Signed   By: Abigail Miyamoto M.D.   On: 09/30/2020 10:58    Procedures Procedures (including critical care time)  Medications Ordered in UC Medications - No data to display  Initial Impression / Assessment and Plan / UC Course  I have reviewed the triage vital signs and the nursing notes.  Pertinent labs & imaging results that were available during my care of the patient were reviewed by me and considered in my medical decision making (see chart for details).     COVID-19, chest tightness and mild shortness of breath. Chest x-ray performed today with no acute findings.  EKG with normal sinus rhythm and nonspecific T wave abnormalities.  No concerns for ACS at this time.  Compared to previous EKG with similar reading VSS and she  is non toxic or ill appearing,  Most likely chest pain related to inflammation from COVID-19 Prescribing  prednisone for pain, inflammation. Tessalon pearls for cough.  Follow up as needed for continued or worsening symptoms  Final Clinical Impressions(s) / UC Diagnoses   Final diagnoses:  COVID-19  Sensation of chest tightness     Discharge Instructions     Your EKG and x-ray were normal.  I believe this is just continued chest tightness, lingering inflammation in the lungs from COVID. We will try to do prednisone daily for the next 5 days to see if this helps.  Tessalon Perles for cough as needed. Work note given    ED Prescriptions    Medication Sig Dispense Auth. Provider   predniSONE (DELTASONE) 10 MG tablet Take 4 tablets (40 mg total) by mouth daily for 5 days. 20 tablet Aleksi Brummet A, NP   benzonatate (TESSALON) 100 MG capsule Take 1 capsule (100 mg total) by mouth every 8 (eight) hours. 21 capsule Daaiyah Baumert A, NP     PDMP not reviewed this encounter.   Orvan July, NP 09/30/20 1556

## 2020-09-30 NOTE — ED Triage Notes (Signed)
Pt states she was covid positive last Wednesday and URI sx began last Tuesday.  Reports continued cough, mid-sternal and left CP onset Tuesday night that pt describes as "aching" 5/10 at present pain level. Reports las night CP 8/10. Also reports "tingling" to left arm for two days, weakness onset yesterday while at work. 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 States she became lightheaded and felt as though she was going to pass out. Pt states that CP becomes sharp when she takes a breath, movement and with palpation. Reports she had some sweatiness this morning but was afebrile.   Denies n/v, pain to jaw, arm, back.  Took toradol tablet last night for CP w/o improvement. EKG performed and given to T. Rozanna Box

## 2021-04-01 ENCOUNTER — Other Ambulatory Visit: Payer: Self-pay

## 2021-04-01 ENCOUNTER — Ambulatory Visit
Admission: RE | Admit: 2021-04-01 | Discharge: 2021-04-01 | Disposition: A | Discharge status: Home / Self Care | Payer: BC Managed Care – PPO | Source: Ambulatory Visit | Attending: Sports Medicine | Admitting: Sports Medicine

## 2021-04-01 VITALS — BP 153/112 | HR 70 | Temp 98.5°F | Resp 18

## 2021-04-01 DIAGNOSIS — R110 Nausea: Secondary | ICD-10-CM

## 2021-04-01 DIAGNOSIS — R0981 Nasal congestion: Secondary | ICD-10-CM

## 2021-04-01 DIAGNOSIS — H6983 Other specified disorders of Eustachian tube, bilateral: Secondary | ICD-10-CM

## 2021-04-01 DIAGNOSIS — H9203 Otalgia, bilateral: Secondary | ICD-10-CM

## 2021-04-01 MED ORDER — FLUTICASONE PROPIONATE 50 MCG/ACT NA SUSP: 2.0000 | Freq: Every day | NASAL | 0 refills | Status: AC

## 2021-04-01 MED ORDER — ONDANSETRON HCL 4 MG PO TABS: 4.0000 mg | ORAL_TABLET | Freq: Four times a day (QID) | ORAL | 0 refills | Status: AC

## 2021-04-01 NOTE — ED Provider Notes (Signed)
[facility]    CSN: [id number] Arrival date & time: 04/01/21  0903      History   Chief Complaint No chief complaint on file.   HPI Yesenia Flores is a 56 y.o. female.   Patient is a 55 year old female who presents for evaluation of bilateral ear pain and fullness right greater than left.  On further history it appears as though she started having a sore throat about a week ago.  Did a home COVID test on 717 which was negative.  Went to a local lab the following day and had a PCR COVID test that was also negative.  She has a mild cough.  Her ear discomfort and pressure began about 3 days ago.  He also feels a little lightheaded at times.  She denies any fever shakes chills.  Some mild nausea is noted.  Mostly postprandial.  She has a history of seasonal allergies but does not take any medicines.  No vomiting or diarrhea.  No abdominal or urinary symptoms.  No chest pain or shortness of breath.  No red flag signs or symptoms elicited on history.   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 Authorizing Provider  fluticasone (FLONASE) 50 MCG/ACT nasal spray Place 2 sprays into both nostrils daily. 04/01/21  Yes Verda Cumins, MD  ondansetron (ZOFRAN) 4 MG tablet Take 1 tablet (4 mg total) by mouth every 6 (six) hours. 04/01/21  Yes Verda Cumins, MD  benzonatate (TESSALON) 100 MG capsule Take 1 capsule (100 mg total) by mouth every 8 (eight) hours. 09/30/20   Orvan July, NP    Family History Family History  Problem Relation Age of Onset   Thyroid disease Mother    Other Father        unknown medical history   Breast cancer Maternal Aunt     Social History Social History   Tobacco Use  Smoking status: Never   Smokeless tobacco: Never  Vaping Use   Vaping Use: Never used  Substance Use Topics   Alcohol use: No   Drug use: No     Allergies   No known allergies   Review of Systems Review of Systems  Constitutional:  Negative for activity change, appetite change, chills, diaphoresis, fatigue and fever.  HENT:  Positive for congestion, ear pain and sore throat. Negative for ear discharge, postnasal drip, rhinorrhea, sinus pressure, sinus pain and sneezing.   Eyes:  Negative for pain.  Respiratory:  Positive for cough. Negative for chest tightness, shortness of breath and wheezing.   Cardiovascular:  Negative for chest pain and palpitations.  Gastrointestinal:  Positive for nausea. Negative for abdominal pain, diarrhea and vomiting.  Genitourinary:  Negative for dysuria.  Musculoskeletal:  Negative for back pain, myalgias and neck pain.  Skin:  Negative for color change, pallor, rash and wound.  Neurological:  Negative for dizziness, light-headedness and headaches.  All other systems reviewed  and are negative.   Physical Exam Triage Vital Signs ED Triage Vitals [04/01/21 0924]  Enc Vitals Group     BP (!) 153/112     Pulse Rate 70     Resp 18     Temp 98.5 F (36.9 C)     Temp Source Oral     SpO2 100 %     Weight      Height      Head Circumference      Peak Flow      Pain Score 4     Pain Loc      Pain Edu?      Excl. in [place]?    No data found.  Updated Vital Signs BP (!) 153/112 (BP Location: Left Arm)   Pulse 70   Temp 98.5 F (36.9 C) (Oral)   Resp 18   LMP 06/22/2015   SpO2 100%   Visual Acuity Right Eye Distance:   Left Eye Distance:   Bilateral Distance:    Right Eye Near:   Left Eye Near:    Bilateral Near:     Physical Exam Vitals and nursing note reviewed.  Constitutional:      General: She is not in acute distress.    Appearance: Normal appearance. She is not ill-appearing, toxic-appearing or diaphoretic.  HENT:     Head: Normocephalic and atraumatic.     Right Ear: Tympanic membrane normal.     Left Ear: Tympanic membrane normal.     Nose: Congestion present. No rhinorrhea.     Mouth/Throat:     Mouth: Mucous membranes are moist.     Pharynx: No oropharyngeal exudate or posterior oropharyngeal erythema.  Eyes:     General: No scleral icterus.       Right eye: No discharge.        Left eye: No discharge.     Extraocular Movements: Extraocular movements intact.     Conjunctiva/sclera: Conjunctivae normal.     Pupils: Pupils are equal, round, and reactive to light.  Cardiovascular:     Rate and Rhythm: Normal rate and regular rhythm.     Pulses: Normal pulses.     Heart sounds: Normal heart sounds. No murmur heard.   No friction rub. No gallop.  Pulmonary:     Effort: Pulmonary effort is normal.     Breath sounds: Normal breath sounds. No stridor. No wheezing, rhonchi or rales.  Musculoskeletal:     Cervical back:  Normal range of motion and neck supple.  Skin:    General: Skin is warm and dry.     Capillary Refill:  Capillary refill takes less than 2 seconds.     Coloration: Skin is not jaundiced.     Findings: No bruising, erythema, lesion or rash.  Neurological:     General: No focal deficit present.     Mental Status: She is alert and oriented to person, place, and time.     UC Treatments / Results  Labs (all labs ordered are listed, but only abnormal results are displayed) Labs Reviewed - No data to display  EKG   Radiology No results found.  Procedures Procedures (including critical care time)  Medications Ordered in UC Medications - No data to display  Initial Impression / Assessment and Plan / UC Course  I have reviewed the triage vital signs and the nursing notes.  Pertinent labs & imaging results that were available during my care of the patient were reviewed by me and considered in my medical decision making (see chart for details).  Clinical impression: 1.  Bilateral ear discomfort and pressure. 2.  Nausea without vomiting. 3.  Nasal congestion. 4.  Probable eustachian tube irritation and dysfunction bilateral.  Treatment plan: 1.  The findings and treatment plan were discussed in detail with the patient.  Patient was in agreement. 2.  We discussed that she did not have a ear infection so no antibiotics were indicated. 3.  I discussed with her that given her symptoms she probably has some eustachian tube issues.  This is consistent with the fact that she also has some seasonal allergies and does not take any medicines.  I recommended just over-the-counter Zyrtec or Allegra for now. 4.  I also prescribed Flonase for her symptoms. 5.  Given that she is having some postprandial nausea I did give her a short course of Zofran to be used as needed. 6.  Educational handouts were provided. 7.  Tylenol or Motrin for any discomfort. 8.  If symptoms persisted she should see her primary care provider.  If she does not have when she should go to the urgent care. 9.  If symptoms worsen  she should go to the ER. 10.  She was discharged in stable condition and will follow-up here as needed.    Final Clinical Impressions(s) / UC Diagnoses   Final diagnoses:  Pain, ear, bilateral  Eustachian tube dysfunction, bilateral  Nausea without vomiting  Nasal congestion     Discharge Instructions      As we discussed, I believe your symptoms are coming from irritation within your eustachian tubes.  Your throat looks fine.  Your ears do not have an infection. I gave you a nasal spray, and a medication should your nausea become worse you can take that. Please see educational handouts. Tylenol or Motrin for any discomfort. Plenty of rest and plenty fluids. We discussed a work note but you deferred. If your symptoms persist please see the primary care physician that you have on file. If they worsen then please go to an urgent care either here or another 1 or the ER.     ED Prescriptions     Medication Sig Dispense Auth. Provider   fluticasone (FLONASE) 50 MCG/ACT nasal spray Place 2 sprays into both nostrils daily. 15.8 mL Verda Cumins, MD   ondansetron (ZOFRAN) 4 MG tablet Take 1 tablet (4 mg total) by mouth every 6 (six) hours. 12 tablet Verda Cumins, MD  PDMP not reviewed this encounter.   Verda Cumins, MD 04/01/21 1153

## 2021-04-01 NOTE — ED Triage Notes (Signed)
Pt presents with bilateral ear pain, R > L.  Reports burning in throat, cough, feeling flushed then chills.  Had two negative COVID tests (one by PCR).  Primary concern is the ear pain.

## 2021-04-01 NOTE — Discharge Instructions (Addendum)
As we discussed, I believe your symptoms are coming from irritation within your eustachian tubes.  Your throat looks fine.  Your ears do not have an infection. I gave you a nasal spray, and a medication should your nausea become worse you can take that. Please see educational handouts. Tylenol or Motrin for any discomfort. Plenty of rest and plenty fluids. We discussed a work note but you deferred. If your symptoms persist please see the primary care physician that you have on file. If they worsen then please go to an urgent care either here or another 1 or the ER.

## 2021-07-22 ENCOUNTER — Ambulatory Visit
Admission: EM | Admit: 2021-07-22 | Discharge: 2021-07-22 | Disposition: A | Discharge status: Home / Self Care | Payer: BC Managed Care – PPO | Attending: Emergency Medicine | Admitting: Emergency Medicine

## 2021-07-22 ENCOUNTER — Other Ambulatory Visit: Payer: Self-pay

## 2021-07-22 ENCOUNTER — Encounter: Payer: Self-pay | Admitting: Emergency Medicine

## 2021-07-22 DIAGNOSIS — B372 Candidiasis of skin and nail: Secondary | ICD-10-CM

## 2021-07-22 MED ORDER — FLUCONAZOLE 150 MG PO TABS: 150.0000 mg | ORAL_TABLET | Freq: Every day | ORAL | 0 refills | Status: AC

## 2021-07-22 MED ORDER — NYSTATIN 100000 UNIT/GM EX POWD: 1.0000 "application " | Freq: Three times a day (TID) | CUTANEOUS | 0 refills | Status: AC

## 2021-07-22 NOTE — Discharge Instructions (Addendum)
Take your Diflucan today and begin using your nystatin powder as directed.  Keep the area clean and dry.  Return for any pus drainage, increased pain, red streaking, fever.

## 2021-07-22 NOTE — ED Provider Notes (Signed)
Chief Complaint   Chief Complaint  Patient presents with   Rash     Subjective, HPI  Yesenia Flores is a very pleasant 55 y.o. female who presents with itchy painful rash in the skin fold of her abdomen where her C-section was completed about 30 years ago.  Patient reports that sutures were displaced after birth which has healed into a deep skin wound for which has created a fold in the area.  She does not report any fever, chills, vomiting, pus drainage.  History obtained from patient.   Patient's problem list, past medical and social history, medications, and allergies were reviewed by me and updated in Epic.    ROS  See HPI.  Objective   Vitals:   07/22/21 1403  BP: (!) 138/91  Pulse: 80  Resp: 18  Temp: 98.2 F (36.8 C)  SpO2: 98%    Vital signs and nursing note reviewed.  General: Appears well-developed and well-nourished. No acute distress.  HEENT: Normocephalic, atraumatic, hearing grossly intact. EOMI, no drainage. No rhinorrhea. Moist mucous membranes.  Neck: Normal range of motion, neck is supple.  Cardiovascular: Normal rate.  Pulm/Chest: No respiratory distress.   Musculoskeletal: No joint deformity, normal range of motion.  Skin: Erythematous white patchy malodorous area noted to left lower abdominal skin fold.  Data  No results found for any visits on 07/22/21.   Assessment & Plan  1. Yeast dermatitis - nystatin (MYCOSTATIN/NYSTOP) powder; Apply 1 application topically 3 (three) times daily for 10 days.  Dispense: 60 g; Refill: 0 - fluconazole (DIFLUCAN) 150 MG tablet; Take 1 tablet (150 mg total) by mouth daily for 1 day.  Dispense: 1 tablet; Refill: 0  55 y.o. female presents with itchy painful rash in the skin fold of her abdomen where her C-section was completed about 30 years ago.  Patient reports that sutures were displaced after birth which has healed into a deep skin wound for which has created a fold in the area.  She does not report any fever,  chills, vomiting, pus drainage.  Given symptoms along with assessment findings, likely yeast dermatitis.  Rx Diflucan and nystatin powder to the patient's preferred pharmacy.  Advised to keep the area clean and dry.  Return for any pus drainage, increased pain, red streaking, fever.  Patient verbalized understanding and agreed with plan.  Patient stable upon discharge.  Return as needed.  Plan:   Discharge Instructions      Take your Diflucan today and begin using your nystatin powder as directed.  Keep the area clean and dry.  Return for any pus drainage, increased pain, red streaking, fever.         Serafina Royals, [place] 07/22/21 1418

## 2021-07-22 NOTE — ED Triage Notes (Signed)
Pt here with itching and painful rash in skin fold of abdomen where her c-section was done 30 years ago. Stiches came out right after birth which caused a deep wound which healed into  a deep fold today.

## 2021-08-12 ENCOUNTER — Encounter: Payer: Self-pay | Admitting: Emergency Medicine

## 2021-08-12 ENCOUNTER — Ambulatory Visit
Admission: EM | Admit: 2021-08-12 | Discharge: 2021-08-12 | Disposition: A | Discharge status: Home / Self Care | Payer: BC Managed Care – PPO | Attending: Emergency Medicine | Admitting: Emergency Medicine

## 2021-08-12 ENCOUNTER — Other Ambulatory Visit: Payer: Self-pay

## 2021-08-12 DIAGNOSIS — J069 Acute upper respiratory infection, unspecified: Secondary | ICD-10-CM

## 2021-08-12 DIAGNOSIS — J0100 Acute maxillary sinusitis, unspecified: Secondary | ICD-10-CM | POA: Diagnosis not present

## 2021-08-12 MED ORDER — AMOXICILLIN-POT CLAVULANATE 875-125 MG PO TABS: 1.0000 | ORAL_TABLET | Freq: Two times a day (BID) | ORAL | 0 refills | Status: AC

## 2021-08-12 NOTE — ED Provider Notes (Signed)
Yesenia Flores    CSN: [id number] Arrival date & time: 08/12/21  1720      History   Chief Complaint Chief Complaint  Patient presents with   Cough   Nasal Congestion   Sore Throat   Headache    HPI Yesenia Flores is a 55 y.o. female.  Patient presents with 10-day history of sinus congestion, sinus pressure, postnasal drip, sore throat, cough.  Symptoms worse for the past 4 days.  She reports low-grade fever; T-max 100.  No rash, shortness of breath, vomiting, diarrhea, or symptoms.  Treatment with OTC cold medication.  The history is provided by the patient and medical records.   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 Authorizing Provider  amoxicillin-clavulanate (AUGMENTIN) 875-125 MG tablet Take 1 tablet by mouth  every 12 (twelve) hours. 08/12/21  Yes Sharion Balloon, NP  benzonatate (TESSALON) 100 MG capsule Take 1 capsule (100 mg total) by mouth every 8 (eight) hours. 09/30/20   Loura Halt A, NP  fluticasone (FLONASE) 50 MCG/ACT nasal spray Place 2 sprays into both nostrils daily. 04/01/21   Verda Cumins, MD  ondansetron (ZOFRAN) 4 MG tablet Take 1 tablet (4 mg total) by mouth every 6 (six) hours. 04/01/21   Verda Cumins, MD    Family History Family History  Problem Relation Age of Onset   Thyroid disease Mother    Other Father        unknown medical history   Breast cancer Maternal Aunt     Social History Social History   Tobacco Use   Smoking status: Never   Smokeless tobacco: Never  Vaping Use   Vaping Use: Never used  Substance Use Topics   Alcohol use: No   Drug use: No     Allergies   No known allergies   Review of Systems Review of Systems  Constitutional:  Positive for fever. Negative  for chills.  HENT:  Positive for congestion, postnasal drip, rhinorrhea, sinus pressure and sore throat. Negative for ear pain.   Respiratory:  Positive for cough. Negative for shortness of breath.   Cardiovascular:  Negative for chest pain and palpitations.  Gastrointestinal:  Negative for diarrhea and vomiting.  Skin:  Negative for color change and rash.  All other systems reviewed and are negative.   Physical Exam Triage Vital Signs ED Triage Vitals  Enc Vitals Group     BP 08/12/21 1759 (!) 165/90     Pulse Rate 08/12/21 1759 98     Resp 08/12/21 1759 18     Temp 08/12/21 1759 99.4 F (37.4 C)     Temp Source 08/12/21 1759 Oral     SpO2 08/12/21 1759 98 %     Weight --      Height --      Head Circumference --      Peak Flow --      Pain Score 08/12/21 1801 0     Pain Loc --      Pain Edu? --      Excl. in [place]? --    No data found.  Updated Vital Signs BP (!) 165/90   Pulse 98   Temp 99.4 F (37.4 C) (Oral)   Resp 18   LMP 06/22/2015   SpO2 98%   Visual  Acuity Right Eye Distance:   Left Eye Distance:   Bilateral Distance:    Right Eye Near:   Left Eye Near:    Bilateral Near:     Physical Exam Vitals and nursing note reviewed.  Constitutional:      General: She is not in acute distress.    Appearance: She is well-developed.  HENT:     Head: Normocephalic and atraumatic.     Right Ear: Tympanic membrane normal.     Left Ear: Tympanic membrane normal.     Nose: Congestion present.     Mouth/Throat:     Mouth: Mucous membranes are moist.     Pharynx: Oropharynx is clear.  Eyes:     Conjunctiva/sclera: Conjunctivae normal.  Cardiovascular:     Rate and Rhythm: Normal rate and regular rhythm.     Heart sounds: Normal heart sounds.  Pulmonary:     Effort: Pulmonary effort is normal. No respiratory distress.     Breath sounds: Normal breath sounds.  Musculoskeletal:     Cervical back: Neck supple.  Skin:    General: Skin is warm and dry.  Neurological:     Mental Status: She is alert.  Psychiatric:        Mood and Affect: Mood normal.        Behavior: Behavior normal.     UC Treatments / Results  Labs (all labs ordered are listed, but only abnormal results are displayed) Labs Reviewed - No data to display  EKG   Radiology No results found.  Procedures Procedures (including critical care time)  Medications Ordered in UC Medications - No data to display  Initial Impression / Assessment and Plan / UC Course  I have reviewed the triage vital signs and the nursing notes.  Pertinent labs & imaging results that were available during my care of the patient were reviewed by me and considered in my medical decision making (see chart for details).    Acute sinusitis, URI.  Patient has been symptomatic for 10 days.  Treating with Augmentin.  Discussed symptomatic treatment such as Tylenol.  Instructed her to follow-up with with her PCP if her symptoms are not improving.  She agrees to plan of care.  Final Clinical  Impressions(s) / UC Diagnoses   Final diagnoses:  Acute non-recurrent maxillary sinusitis  Upper respiratory tract infection, unspecified type     Discharge Instructions      Take the Augmentin as directed.  Follow up with your primary care provider if your symptoms are not improving.        ED Prescriptions     Medication Sig Dispense Auth. Provider   amoxicillin-clavulanate (AUGMENTIN) 875-125 MG tablet Take 1 tablet by mouth every 12 (twelve) hours. 14 tablet Sharion Balloon, NP      PDMP not reviewed this encounter.   Sharion Balloon, NP 08/12/21 [phone]

## 2021-08-12 NOTE — Discharge Instructions (Addendum)
Take the Augmentin as directed.  Follow up with your primary care provider if your symptoms are not improving.    

## 2021-08-12 NOTE — ED Triage Notes (Signed)
Pt here with URI sx x3 days. Patient states it is a sinus infection that she gets twice a year. Took OTC fever control about an hour ago.

## 2021-10-18 ENCOUNTER — Other Ambulatory Visit: Payer: Self-pay

## 2021-10-18 ENCOUNTER — Encounter: Payer: Self-pay | Admitting: Emergency Medicine

## 2021-10-18 ENCOUNTER — Ambulatory Visit
Admission: EM | Admit: 2021-10-18 | Discharge: 2021-10-18 | Disposition: A | Discharge status: Other Acute Inpatient Hospital | Payer: BC Managed Care – PPO

## 2021-10-18 ENCOUNTER — Emergency Department: Payer: BC Managed Care – PPO

## 2021-10-18 ENCOUNTER — Emergency Department
Admission: EM | Admit: 2021-10-18 | Discharge: 2021-10-18 | Disposition: A | Discharge status: Home / Self Care | Payer: BC Managed Care – PPO | Attending: Emergency Medicine | Admitting: Emergency Medicine

## 2021-10-18 DIAGNOSIS — R42 Dizziness and giddiness: Secondary | ICD-10-CM

## 2021-10-18 DIAGNOSIS — R0600 Dyspnea, unspecified: Secondary | ICD-10-CM | POA: Diagnosis not present

## 2021-10-18 DIAGNOSIS — R0602 Shortness of breath: Secondary | ICD-10-CM | POA: Insufficient documentation

## 2021-10-18 DIAGNOSIS — I160 Hypertensive urgency: Secondary | ICD-10-CM

## 2021-10-18 DIAGNOSIS — R079 Chest pain, unspecified: Secondary | ICD-10-CM | POA: Diagnosis not present

## 2021-10-18 DIAGNOSIS — R0789 Other chest pain: Secondary | ICD-10-CM | POA: Insufficient documentation

## 2021-10-18 DIAGNOSIS — M546 Pain in thoracic spine: Secondary | ICD-10-CM | POA: Diagnosis not present

## 2021-10-18 LAB — BASIC METABOLIC PANEL
Anion gap: 7 (ref 5–15)
BUN: 10 mg/dL (ref 6–20)
CO2: 25 mmol/L (ref 22–32)
Calcium: 9.9 mg/dL (ref 8.9–10.3)
Chloride: 107 mmol/L (ref 98–111)
Creatinine, Ser: 0.77 mg/dL (ref 0.44–1.00)
GFR, Estimated: >60 mL/min (ref >60)
Glucose, Bld: 103 mg/dL — ABNORMAL HIGH (ref 70–99)
Potassium: 4.1 mmol/L (ref 3.5–5.1)
Sodium: 139 mmol/L (ref 135–145)

## 2021-10-18 LAB — CBC
HCT: 34 % — ABNORMAL LOW (ref 36.0–46.0)
Hemoglobin: 10.4 g/dL — ABNORMAL LOW (ref 12.0–15.0)
MCH: 25 pg — ABNORMAL LOW (ref 26.0–34.0)
MCHC: 30.6 g/dL (ref 30.0–36.0)
MCV: 81.7 fL (ref 80.0–100.0)
Platelets: 225 10*3/uL (ref 150–400)
RBC: 4.16 MIL/uL (ref 3.87–5.11)
RDW: 14.8 % (ref 11.5–15.5)
WBC: 5.2 10*3/uL (ref 4.0–10.5)
nRBC: 0 % (ref 0.0–0.2)

## 2021-10-18 LAB — TROPONIN I (HIGH SENSITIVITY): Troponin I (High Sensitivity): 2 ng/L (ref <18)

## 2021-10-18 NOTE — ED Triage Notes (Signed)
Pt here with chest pain radiating to back with tingling down both arms x 1 week. Pt has multiple stressors currently, but does not have hx of anxiety. Pt drove herself here.

## 2021-10-18 NOTE — ED Triage Notes (Signed)
Pt to ED for intermittent centralized chest pain for 2 weeks. Reports pain has been more consistent now. Radiating to back. +shob. Able to speak in complete sentences, Nad noted.  +nausea.

## 2021-10-18 NOTE — ED Provider Notes (Signed)
[facility] Provider Note    Event Date/Time   First MD Initiated Contact with Patient 10/18/21 [phone]     (approximate)   History   Chest Pain   HPI  Yesenia Flores is a 56 y.o. female with past medical history of anemia who presents with chest pain.  Patient symptoms started several weeks ago.  Pain is located in the left chest and is dull pressure-like in sensation.  Pain is intermittent, worse with movement but not with exertion or walking.  It lasts for minutes at a time.  Is not pleuritic.  Last several days has had intermittent left back pain as well.  Pain located in the upper left side without radiation.  It is sharp comes and goes last for seconds at a time.  She has mild associated dyspnea no fevers chills or cough.  Denies any associated nausea or diaphoresis with the chest pain.  Patient notes that she has been under significant amount of stress lately-grandchild recently passed away and she has been selling her house thinks that this could be contributing. The patient denies hx of prior DVT/PE, unilateral leg pain/swelling, hormone use, recent surgery, hx of cancer, prolonged immobilization, or hemoptysis.  She has no prior cardiac history.     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 --      Excl. in [place]? --     Most recent vital signs: Vitals:   10/18/21 0952 10/18/21 1204  BP: (!) 172/97 (!) 155/74  Pulse: 85   Resp: 18 17  Temp: 98.6 F (37 C) 98.8 F (37.1 C)  SpO2: 100% 96%     General: Awake, no distress.  CV:  Good peripheral perfusion.  Resp:  Normal effort.  Abd:  No distention.  Neuro:             Awake, Alert, Oriented x 3  Other:     ED Results / Procedures / Treatments  Labs (all labs ordered are listed, but only abnormal results are displayed) Labs Reviewed  BASIC METABOLIC PANEL - Abnormal; Notable for the following components:  Result Value   Glucose, Bld 103 (*)    All other components within normal limits  CBC - Abnormal; Notable for the following components:   Hemoglobin 10.4 (*)    HCT 34.0 (*)    MCH 25.0 (*)    All other components within normal limits  POC URINE PREG, ED  TROPONIN I (HIGH SENSITIVITY)  TROPONIN I (HIGH SENSITIVITY)     EKG  EKG interpretation performed by myself: NSR, nml axis, nml intervals, slightT depression inferior leads and alteral precordial    RADIOLOGY    PROCEDURES:  Critical Care performed: No  .1-3 Lead EKG Interpretation Performed by: Rada Hay, MD Authorized by: Rada Hay, MD     Interpretation: normal     ECG rate assessment: normal     Rhythm: sinus rhythm     Ectopy: none     Conduction: normal    The patient is on the cardiac monitor to evaluate for evidence of arrhythmia and/or significant heart rate changes.   MEDICATIONS ORDERED IN ED: Medications - No data to display   IMPRESSION / MDM / [place] / ED COURSE  I reviewed the triage vital signs and  the nursing notes.                              Differential diagnosis includes, but is not limited to, angina, ACS, pulmonary embolism, aortic dissection, anxiety, musculoskeletal  This patient is a 56 year old female presents with intermittent chest pain times several weeks.  Pain is atypical and that it is intermittent nonexertional and with minimal associated symptoms other than some intermittent shortness of breath.  She has also had some developing left back pain which is different than and happens in isolation of the chest pain sharp and left thoracic pain.  No prior cardiac history.  Patient tells me she has been under significant amount of stress.  She comes today because pain was simply not getting better but did not worsen.  Patient says she is very active at work and activity does not bring on her symptoms.  Vital signs are notable for hypertension but otherwise within normal limits.  I reviewed her EKG she does have some subtle ST depression in the inferior leads when ST segment is compared to the TP segment.  Will get troponins.  My suggestion for PE is low given the intermittent nature of the symptoms and the chronicity.  Presentation also is not consistent with dissection.    Patient's troponin is 2, given pain has been going on for greater than 3 hours and she has no other risk factors do not feel the need to repeat.  Reviewed her chest x-ray which has no focal infiltrate.  CBC and BMP are also reassuring.  I do think that she should have cardiology follow-up as she may need to have a stress test.  Will refer to cardiology.      FINAL CLINICAL IMPRESSION(S) / ED DIAGNOSES   Final diagnoses:  Chest pain, unspecified type     Rx / DC Orders   ED Discharge Orders     None        Note:  This document was prepared using Dragon voice recognition software and may include unintentional dictation errors.   Rada Hay, MD 10/18/21 [phone]

## 2021-10-18 NOTE — ED Provider Notes (Signed)
[name]    CSN: [id number] Arrival date & time: 10/18/21  0906      History   Chief Complaint Chief Complaint  Patient presents with   Chest Pain    HPI Yesenia Flores is a 56 y.o. female. Patient presents with chest pain intermittently for 1.5 weeks but is worse since yesterday.  It now radiates through to her back and she has associated dizziness and shortness of breath with exertion.  The chest pain is midsternal, intermittent, worse at night, no aggravating or alleviating factors, currently 6/10.  Patient feels nausea today but has no vomiting.  She denies focal weakness, shortness of breath at rest, or other symptoms.  She denies history of cardiovascular disease.   The history is provided by the patient and medical records.   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 Authorizing Provider  amoxicillin-clavulanate (AUGMENTIN) 875-125 MG tablet Take 1 tablet by mouth every 12 (twelve) hours. 08/12/21   Sharion Balloon, NP  benzonatate (TESSALON) 100 MG capsule Take 1 capsule (100 mg total) by mouth every 8 (eight) hours. 09/30/20   Loura Halt A, NP  fluticasone (FLONASE) 50 MCG/ACT nasal spray Place 2 sprays into both nostrils daily. 04/01/21   Verda Cumins, MD  ondansetron (ZOFRAN) 4 MG tablet Take 1 tablet (4 mg total) by mouth every 6 (six) hours. 04/01/21   Verda Cumins, MD    Family History Family History  Problem Relation Age of Onset   Thyroid disease Mother    Other Father        unknown medical history   Breast cancer Maternal Aunt     Social History Social History   Tobacco Use   Smoking status: Never   Smokeless tobacco: Never  Vaping Use   Vaping Use: Never used  Substance Use Topics   Alcohol use: No   Drug use: No  Allergies   No known allergies   Review of Systems Review of Systems  Constitutional:  Negative for chills and fever.  Respiratory:  Positive for shortness of breath. Negative for cough.   Cardiovascular:  Positive for chest pain. Negative for palpitations.  Gastrointestinal:  Positive for nausea. Negative for abdominal pain and vomiting.  Skin:  Negative for color change and rash.  Neurological:  Positive for dizziness. Negative for syncope, facial asymmetry, speech difficulty, weakness, numbness and headaches.  All other systems reviewed and are negative.   Physical Exam Triage Vital Signs ED Triage Vitals [10/18/21 0915]  Enc Vitals Group     BP (!) 183/109     Pulse Rate (!) 102     Resp 18     Temp 98.1 F (36.7 C)     Temp src      SpO2 99 %     Weight      Height      Head Circumference      Peak Flow      Pain Score      Pain Loc      Pain Edu?      Excl. in [place]?    No data found.  Updated Vital Signs BP (!) 183/109 (BP  Location: Left Arm)    Pulse (!) 102    Temp 98.1 F (36.7 C)    Resp 18    LMP 06/22/2015    SpO2 99%   Visual Acuity Right Eye Distance:   Left Eye Distance:   Bilateral Distance:    Right Eye Near:   Left Eye Near:    Bilateral Near:     Physical Exam Vitals and nursing note reviewed.  Constitutional:      General: She is not in acute distress.    Appearance: She is well-developed. She is ill-appearing.  HENT:     Mouth/Throat:     Mouth: Mucous membranes are moist.  Eyes:     Extraocular Movements: Extraocular movements intact.     Pupils: Pupils are equal, round, and reactive to light.  Cardiovascular:     Rate and Rhythm: Normal rate and regular rhythm.     Heart sounds: Normal heart sounds.  Pulmonary:     Effort: Pulmonary effort is normal. No respiratory distress.     Breath sounds: Normal breath sounds.  Abdominal:     Palpations: Abdomen is soft.     Tenderness: There is no abdominal tenderness.  Musculoskeletal:     Cervical back: Neck supple.     Right lower leg: No edema.     Left lower leg: No edema.  Skin:    General: Skin is warm and dry.  Neurological:     General: No focal deficit present.     Mental Status: She is alert and oriented to person, place, and time.     Cranial Nerves: No cranial nerve deficit.     Sensory: No sensory deficit.     Motor: No weakness.     Gait: Gait normal.  Psychiatric:        Mood and Affect: Mood normal.        Behavior: Behavior normal.     UC Treatments / Results  Labs (all labs ordered are listed, but only abnormal results are displayed) Labs Reviewed - No data to display  EKG   Radiology No results found.  Procedures Procedures (including critical care time)  Medications Ordered in UC Medications - No data to display  Initial Impression / Assessment and Plan / UC Course  I have reviewed the triage vital signs and the nursing notes.  Pertinent labs & imaging results that were available during  my care of the patient were reviewed by me and considered in my medical decision making (see chart for details).    Chest pain, Dizziness, Shortness of breath, Hypertensive urgency. BP 183/109. EKG shows sinus rhythm, rate 88, no ST elevation, compared to previous from 09/30/2020.  Sending patient to the ED for evaluation.  Patient repeatedly declines transport by EMS and states she is stable to drive herself.  She agrees to go directly to the ED.  Final Clinical Impressions(s) / UC Diagnoses   Final diagnoses:  Chest pain, unspecified type  Dizziness  Shortness of breath  Hypertensive urgency     Discharge Instructions      Go directly to the emergency department for evaluation of your chest pain and other symptoms.         ED Prescriptions   None    PDMP not reviewed this encounter.   Sharion Balloon, NP 10/18/21 [phone]

## 2021-10-18 NOTE — Discharge Instructions (Signed)
Your cardiac enzymes were negative and your EKG was reassuring.  The rest of your blood work was also normal.  You are not having a heart attack today but we cannot say for sure that your pain is not related to your heart.  Please follow-up with cardiology as you may need to have a stress test at some point.

## 2021-10-18 NOTE — Discharge Instructions (Addendum)
Go directly to the emergency department for evaluation of your chest pain and other symptoms.
